# Patient Record
Sex: Male | Born: 1960 | Race: Black or African American | Hispanic: No | Smoking: Never smoker
Health system: Southern US, Community
[De-identification: ages and names within clinical notes are randomized; demographics above are authoritative.]

## PROBLEM LIST (undated history)

## (undated) DIAGNOSIS — E119 Type 2 diabetes mellitus without complications: Secondary | ICD-10-CM

## (undated) DIAGNOSIS — M1990 Unspecified osteoarthritis, unspecified site: Secondary | ICD-10-CM

## (undated) DIAGNOSIS — K819 Cholecystitis, unspecified: Secondary | ICD-10-CM

## (undated) DIAGNOSIS — K760 Fatty (change of) liver, not elsewhere classified: Secondary | ICD-10-CM

## (undated) DIAGNOSIS — E669 Obesity, unspecified: Secondary | ICD-10-CM

## (undated) HISTORY — DX: Cholecystitis, unspecified: K81.9

## (undated) HISTORY — DX: Unspecified osteoarthritis, unspecified site: M19.90

## (undated) HISTORY — DX: Obesity, unspecified: E66.9

## (undated) HISTORY — PX: CHOLECYSTECTOMY: SHX55

## (undated) HISTORY — DX: Fatty (change of) liver, not elsewhere classified: K76.0

---

## 2000-11-26 ENCOUNTER — Emergency Department (HOSPITAL_COMMUNITY): Admission: EM | Admit: 2000-11-26 | Discharge: 2000-11-26 | Payer: Self-pay | Admitting: Emergency Medicine

## 2002-10-07 ENCOUNTER — Emergency Department (HOSPITAL_COMMUNITY): Admission: AC | Admit: 2002-10-07 | Discharge: 2002-10-07 | Payer: Self-pay

## 2004-07-26 ENCOUNTER — Emergency Department (HOSPITAL_COMMUNITY): Admission: EM | Admit: 2004-07-26 | Discharge: 2004-07-26 | Payer: Self-pay | Admitting: Emergency Medicine

## 2005-02-27 ENCOUNTER — Emergency Department (HOSPITAL_COMMUNITY): Admission: EM | Admit: 2005-02-27 | Discharge: 2005-02-27 | Payer: Self-pay | Admitting: Emergency Medicine

## 2010-07-31 ENCOUNTER — Ambulatory Visit: Payer: Self-pay | Admitting: Internal Medicine

## 2010-07-31 ENCOUNTER — Encounter (INDEPENDENT_AMBULATORY_CARE_PROVIDER_SITE_OTHER): Payer: Self-pay | Admitting: *Deleted

## 2010-07-31 LAB — CONVERTED CEMR LAB
Basophils Relative: 0 % (ref 0–1)
CO2: 27 meq/L (ref 19–32)
Chloride: 104 meq/L (ref 96–112)
Eosinophils Absolute: 0 10*3/uL (ref 0.0–0.7)
Glucose, Bld: 94 mg/dL (ref 70–99)
HCT: 46.3 % (ref 39.0–52.0)
Hemoglobin: 15.7 g/dL (ref 13.0–17.0)
Lymphocytes Relative: 41 % (ref 12–46)
Lymphs Abs: 3.3 10*3/uL (ref 0.7–4.0)
MCHC: 33.9 g/dL (ref 30.0–36.0)
Monocytes Absolute: 0.7 10*3/uL (ref 0.1–1.0)
Monocytes Relative: 8 % (ref 3–12)
Neutro Abs: 4.2 10*3/uL (ref 1.7–7.7)
Neutrophils Relative %: 51 % (ref 43–77)
Platelets: 224 10*3/uL (ref 150–400)
RBC: 4.97 M/uL (ref 4.22–5.81)
RDW: 12.6 % (ref 11.5–15.5)
TSH: 0.688 microintl units/mL (ref 0.350–4.500)
Total Bilirubin: 0.5 mg/dL (ref 0.3–1.2)

## 2010-08-07 ENCOUNTER — Encounter (INDEPENDENT_AMBULATORY_CARE_PROVIDER_SITE_OTHER): Payer: Self-pay | Admitting: *Deleted

## 2010-08-07 ENCOUNTER — Ambulatory Visit (HOSPITAL_COMMUNITY): Admission: RE | Admit: 2010-08-07 | Discharge: 2010-08-07 | Payer: Self-pay | Admitting: Family Medicine

## 2010-08-07 LAB — CONVERTED CEMR LAB
Sex Hormone Binding: 22 nmol/L (ref 13–71)
Testosterone Free: 71.8 pg/mL (ref 47.0–244.0)
Testosterone: 295.12 ng/dL (ref 250–890)
VLDL: 13 mg/dL (ref 0–40)

## 2010-08-20 ENCOUNTER — Ambulatory Visit (HOSPITAL_COMMUNITY): Admission: RE | Admit: 2010-08-20 | Discharge: 2010-08-20 | Payer: Self-pay | Admitting: Internal Medicine

## 2010-10-31 ENCOUNTER — Encounter
Admission: RE | Admit: 2010-10-31 | Discharge: 2010-11-24 | Payer: Self-pay | Source: Home / Self Care | Attending: Family Medicine | Admitting: Family Medicine

## 2010-11-04 ENCOUNTER — Encounter: Admission: RE | Admit: 2010-11-04 | Payer: Self-pay | Source: Home / Self Care | Admitting: Family Medicine

## 2015-01-06 ENCOUNTER — Emergency Department (HOSPITAL_COMMUNITY): Payer: Self-pay

## 2015-01-06 ENCOUNTER — Encounter (HOSPITAL_COMMUNITY): Payer: Self-pay | Admitting: Family Medicine

## 2015-01-06 ENCOUNTER — Inpatient Hospital Stay (HOSPITAL_COMMUNITY)
Admission: EM | Admit: 2015-01-06 | Discharge: 2015-01-09 | DRG: 419 | Disposition: A | Discharge status: Home / Self Care | Payer: Self-pay | Attending: Internal Medicine | Admitting: Internal Medicine

## 2015-01-06 DIAGNOSIS — K8066 Calculus of gallbladder and bile duct with acute and chronic cholecystitis without obstruction: Principal | ICD-10-CM | POA: Diagnosis present

## 2015-01-06 DIAGNOSIS — K8010 Calculus of gallbladder with chronic cholecystitis without obstruction: Secondary | ICD-10-CM | POA: Diagnosis present

## 2015-01-06 DIAGNOSIS — R109 Unspecified abdominal pain: Secondary | ICD-10-CM

## 2015-01-06 DIAGNOSIS — Z6833 Body mass index (BMI) 33.0-33.9, adult: Secondary | ICD-10-CM

## 2015-01-06 DIAGNOSIS — K8050 Calculus of bile duct without cholangitis or cholecystitis without obstruction: Secondary | ICD-10-CM

## 2015-01-06 DIAGNOSIS — E119 Type 2 diabetes mellitus without complications: Secondary | ICD-10-CM | POA: Diagnosis present

## 2015-01-06 DIAGNOSIS — K819 Cholecystitis, unspecified: Secondary | ICD-10-CM

## 2015-01-06 DIAGNOSIS — R932 Abnormal findings on diagnostic imaging of liver and biliary tract: Secondary | ICD-10-CM

## 2015-01-06 DIAGNOSIS — M1612 Unilateral primary osteoarthritis, left hip: Secondary | ICD-10-CM | POA: Diagnosis present

## 2015-01-06 HISTORY — DX: Type 2 diabetes mellitus without complications: E11.9

## 2015-01-06 LAB — CBC WITH DIFFERENTIAL/PLATELET
BASOS ABS: 0 10*3/uL (ref 0.0–0.1)
Basophils Relative: 0 % (ref 0–1)
EOS ABS: 0 10*3/uL (ref 0.0–0.7)
Eosinophils Relative: 0 % (ref 0–5)
HCT: 43.6 % (ref 39.0–52.0)
HEMOGLOBIN: 15 g/dL (ref 13.0–17.0)
LYMPHS ABS: 3.8 10*3/uL (ref 0.7–4.0)
Lymphocytes Relative: 35 % (ref 12–46)
MCH: 31.1 pg (ref 26.0–34.0)
MCHC: 34.4 g/dL (ref 30.0–36.0)
MCV: 90.3 fL (ref 78.0–100.0)
MONOS PCT: 6 % (ref 3–12)
Monocytes Absolute: 0.7 10*3/uL (ref 0.1–1.0)
Neutro Abs: 6.4 10*3/uL (ref 1.7–7.7)
Neutrophils Relative %: 59 % (ref 43–77)
PLATELETS: 242 10*3/uL (ref 150–400)
RBC: 4.83 MIL/uL (ref 4.22–5.81)
RDW: 12.5 % (ref 11.5–15.5)
WBC: 10.9 10*3/uL — AB (ref 4.0–10.5)

## 2015-01-06 LAB — GLUCOSE, CAPILLARY
GLUCOSE-CAPILLARY: 207 mg/dL — AB (ref 70–99)
Glucose-Capillary: 130 mg/dL — ABNORMAL HIGH (ref 70–99)

## 2015-01-06 LAB — LIPASE, BLOOD: Lipase: 23 U/L (ref 11–59)

## 2015-01-06 LAB — COMPREHENSIVE METABOLIC PANEL
ALT: 14 U/L (ref 0–53)
AST: 16 U/L (ref 0–37)
Albumin: 3.4 g/dL — ABNORMAL LOW (ref 3.5–5.2)
Alkaline Phosphatase: 36 U/L — ABNORMAL LOW (ref 39–117)
Anion gap: 10 (ref 5–15)
BILIRUBIN TOTAL: 0.6 mg/dL (ref 0.3–1.2)
BUN: 7 mg/dL (ref 6–23)
CALCIUM: 8.9 mg/dL (ref 8.4–10.5)
CO2: 21 mmol/L (ref 19–32)
Chloride: 108 mmol/L (ref 96–112)
Creatinine, Ser: 1.22 mg/dL (ref 0.50–1.35)
GFR, EST AFRICAN AMERICAN: 75 mL/min — AB (ref >90)
GFR, EST NON AFRICAN AMERICAN: 65 mL/min — AB (ref >90)
Glucose, Bld: 120 mg/dL — ABNORMAL HIGH (ref 70–99)
Potassium: 3.8 mmol/L (ref 3.5–5.1)
Sodium: 139 mmol/L (ref 135–145)
TOTAL PROTEIN: 6.1 g/dL (ref 6.0–8.3)

## 2015-01-06 MED ORDER — PANTOPRAZOLE SODIUM 40 MG IV SOLR
40.0000 mg | Freq: Every day | INTRAVENOUS | Status: DC
  Administered 2015-01-06 – 2015-01-08 (×3): 40 mg via INTRAVENOUS
  Filled 2015-01-06 (×3): qty 40

## 2015-01-06 MED ORDER — HEPARIN SODIUM (PORCINE) 5000 UNIT/ML IJ SOLN
5000.0000 [IU] | Freq: Once | INTRAMUSCULAR | Status: AC
  Administered 2015-01-06: 5000 [IU] via SUBCUTANEOUS
  Filled 2015-01-06: qty 1

## 2015-01-06 MED ORDER — INSULIN ASPART 100 UNIT/ML ~~LOC~~ SOLN
0.0000 [IU] | Freq: Three times a day (TID) | SUBCUTANEOUS | Status: DC
  Administered 2015-01-07 (×2): 2 [IU] via SUBCUTANEOUS
  Administered 2015-01-08: 3 [IU] via SUBCUTANEOUS
  Administered 2015-01-08: 2 [IU] via SUBCUTANEOUS
  Administered 2015-01-09: 3 [IU] via SUBCUTANEOUS

## 2015-01-06 MED ORDER — SODIUM CHLORIDE 0.9 % IV BOLUS (SEPSIS)
1000.0000 mL | Freq: Once | INTRAVENOUS | Status: AC
  Administered 2015-01-06: 1000 mL via INTRAVENOUS

## 2015-01-06 MED ORDER — MORPHINE SULFATE 2 MG/ML IJ SOLN
1.0000 mg | INTRAMUSCULAR | Status: DC | PRN
  Administered 2015-01-07 – 2015-01-08 (×6): 4 mg via INTRAVENOUS
  Filled 2015-01-06 (×6): qty 2

## 2015-01-06 MED ORDER — DIPHENHYDRAMINE HCL 12.5 MG/5ML PO ELIX: 12.5000 mg | ORAL_SOLUTION | Freq: Four times a day (QID) | ORAL | Status: DC | PRN

## 2015-01-06 MED ORDER — CEFTRIAXONE SODIUM IN DEXTROSE 40 MG/ML IV SOLN
2.0000 g | INTRAVENOUS | Status: DC
  Administered 2015-01-06 – 2015-01-07 (×2): 2 g via INTRAVENOUS
  Filled 2015-01-06 (×3): qty 50

## 2015-01-06 MED ORDER — ONDANSETRON HCL 4 MG/2ML IJ SOLN: 4.0000 mg | Freq: Four times a day (QID) | INTRAMUSCULAR | Status: DC | PRN

## 2015-01-06 MED ORDER — DIPHENHYDRAMINE HCL 50 MG/ML IJ SOLN: 12.5000 mg | Freq: Four times a day (QID) | INTRAMUSCULAR | Status: DC | PRN

## 2015-01-06 MED ORDER — ONDANSETRON HCL 4 MG/2ML IJ SOLN
4.0000 mg | Freq: Once | INTRAMUSCULAR | Status: AC
  Administered 2015-01-06: 4 mg via INTRAVENOUS
  Filled 2015-01-06: qty 2

## 2015-01-06 MED ORDER — POTASSIUM CHLORIDE IN NACL 20-0.9 MEQ/L-% IV SOLN
INTRAVENOUS | Status: DC
  Administered 2015-01-06: 21:00:00 via INTRAVENOUS
  Filled 2015-01-06 (×3): qty 1000

## 2015-01-06 MED ORDER — MORPHINE SULFATE 4 MG/ML IJ SOLN
4.0000 mg | Freq: Once | INTRAMUSCULAR | Status: AC
  Administered 2015-01-06: 4 mg via INTRAVENOUS
  Filled 2015-01-06: qty 1

## 2015-01-06 NOTE — ED Notes (Signed)
Attempted report, was told nurse just walked into a patient room will return call.

## 2015-01-06 NOTE — H&P (Signed)
Kevin Sanchez is an 54 y.o. male.   Chief Complaint: abd pain HPI: 54yo obese AAM came to the ED earlier today bc of ongoing abdominal pain. He states he ate a hamburger yesterday and then developed RUQ/rt abd pain with N/V. It was persistent so he came to the ED. Pain is somewhat better after meds. Denies f/c/d/c/jaundice/wt loss. Had similar episode about 2 weeks ago and went to [facility] and underwent u/s and ct was told he had gallstones. I don't have those records. He doesn't have a PCP but gets his metformin from a clinic. Denies etoh and tob and drugs.   Past Medical History  Diagnosis Date  . Diabetes mellitus without complication     History reviewed. No pertinent past surgical history.  History reviewed. No pertinent family history. Social History:  reports that he has never smoked. He does not have any smokeless tobacco history on file. He reports that he does not drink alcohol or use illicit drugs.  Allergies: No Known Allergies   (Not in a hospital admission)  Results for orders placed or performed during the hospital encounter of 01/06/15 (from the past 48 hour(s))  CBC with Differential     Status: Abnormal   Collection Time: 01/06/15 12:51 PM  Result Value Ref Range   WBC 10.9 (H) 4.0 - 10.5 K/uL   RBC 4.83 4.22 - 5.81 MIL/uL   Hemoglobin 15.0 13.0 - 17.0 g/dL   HCT 43.6 39.0 - 52.0 %   MCV 90.3 78.0 - 100.0 fL   MCH 31.1 26.0 - 34.0 pg   MCHC 34.4 30.0 - 36.0 g/dL   RDW 12.5 11.5 - 15.5 %   Platelets 242 150 - 400 K/uL   Neutrophils Relative % 59 43 - 77 %   Neutro Abs 6.4 1.7 - 7.7 K/uL   Lymphocytes Relative 35 12 - 46 %   Lymphs Abs 3.8 0.7 - 4.0 K/uL   Monocytes Relative 6 3 - 12 %   Monocytes Absolute 0.7 0.1 - 1.0 K/uL   Eosinophils Relative 0 0 - 5 %   Eosinophils Absolute 0.0 0.0 - 0.7 K/uL   Basophils Relative 0 0 - 1 %   Basophils Absolute 0.0 0.0 - 0.1 K/uL  Comprehensive metabolic panel     Status: Abnormal   Collection  Time: 01/06/15 12:51 PM  Result Value Ref Range   Sodium 139 135 - 145 mmol/L   Potassium 3.8 3.5 - 5.1 mmol/L   Chloride 108 96 - 112 mmol/L   CO2 21 19 - 32 mmol/L   Glucose, Bld 120 (H) 70 - 99 mg/dL   BUN 7 6 - 23 mg/dL   Creatinine, Ser 1.22 0.50 - 1.35 mg/dL   Calcium 8.9 8.4 - 10.5 mg/dL   Total Protein 6.1 6.0 - 8.3 g/dL   Albumin 3.4 (L) 3.5 - 5.2 g/dL   AST 16 0 - 37 U/L   ALT 14 0 - 53 U/L   Alkaline Phosphatase 36 (L) 39 - 117 U/L   Total Bilirubin 0.6 0.3 - 1.2 mg/dL   GFR calc non Af Amer 65 (L) >90 mL/min   GFR calc Af Amer 75 (L) >90 mL/min    Comment: (NOTE) The eGFR has been calculated using the CKD EPI equation. This calculation has not been validated in all clinical situations. eGFR's persistently <90 mL/min signify possible Chronic Kidney Disease.    Anion gap 10 5 - 15  Lipase, blood  Status: None   Collection Time: 01/06/15 12:51 PM  Result Value Ref Range   Lipase 23 11 - 59 U/L   US Abdomen Complete  01/06/2015   CLINICAL DATA:  Abdominal pain  EXAM: ULTRASOUND ABDOMEN COMPLETE  COMPARISON:  12/11/2014.  FINDINGS: Gallbladder: Gallstones are identified within the gallbladder fundus. The gallbladder wall is mildly thickened measuring 3 mm. Negative sonographic Murphy's sign.  Common bile duct: Diameter: 4.8 mm.  Liver: No focal lesion identified. The liver exhibits increased echogenicity compatible with hepatic steatosis.  IVC: No abnormality visualized.  Pancreas: Visualized portion unremarkable.  Spleen: Size and appearance within normal limits.  Right Kidney: Length: 12.2 cm. Echogenicity within normal limits. No mass or hydronephrosis visualized.  Left Kidney: Length: 12.4 cm. Echogenicity within normal limits. No mass or hydronephrosis visualized.  Abdominal aorta: No aneurysm visualized.  Other findings: None.  IMPRESSION: 1. Gallstones. The gallbladder wall is upper limits of normal in thickness measuring 3 mm. Cannot rule out mild/early cholecystitis.  If further imaging is clinically indicated a hepatobiliary scan may be helpful to assess the patency of the cystic duct. 2. Hepatic steatosis.   Electronically Signed   By: Kerby Moors M.D.   On: 01/06/2015 15:59   Dg Hip Unilat With Pelvis 2-3 Views Left  01/06/2015   CLINICAL DATA:  Chronic left anterior hip/groin pain that has been ongoing for over 6 years and has been progressively worsening; pt states he has difficulty walking on it due to pain; pt states he had an injury many years ago due to playing football and was supposed to have surgery but never did; pt states he has been told he had spurring also.  EXAM: LEFT HIP (WITH PELVIS) 2-3 VIEWS  COMPARISON:  08/07/2010  FINDINGS: Exam demonstrates moderate degenerative changes of the left hip and mild degenerative change of the right hip without significant change. No acute fracture or dislocation. Remainder of the exam is unchanged.  IMPRESSION: No acute findings.  Moderate osteoarthritic change of the left hip and mild osteoarthritic change of the right hip.   Electronically Signed   By: Marin Olp M.D.   On: 01/06/2015 15:26    Review of Systems  Constitutional: Negative for fever, chills and weight loss.  HENT: Negative for nosebleeds.   Eyes: Negative for blurred vision.  Respiratory: Negative for shortness of breath.   Cardiovascular: Negative for chest pain, palpitations, orthopnea and PND.       Denies DOE  Gastrointestinal: Positive for nausea, vomiting and abdominal pain. Negative for diarrhea, constipation, blood in stool and melena.  Genitourinary: Negative for dysuria and hematuria.  Musculoskeletal: Negative.        L hip pain  Skin: Negative for itching and rash.  Neurological: Negative for dizziness, focal weakness, seizures, loss of consciousness and headaches.       Denies TIAs, amaurosis fugax  Endo/Heme/Allergies: Does not bruise/bleed easily.  Psychiatric/Behavioral: The patient is not nervous/anxious.      Blood pressure 115/67, pulse 66, temperature 98.6 F (37 C), resp. rate 18, height '6\' 2"'  (1.88 m), weight 246 lb (111.585 kg), SpO2 98 %. Physical Exam  Vitals reviewed. Constitutional: He is oriented to person, place, and time. He appears well-developed and well-nourished. No distress.  Morbidly obese  HENT:  Head: Normocephalic and atraumatic.  Right Ear: External ear normal.  Left Ear: External ear normal.  Eyes: Conjunctivae are normal. No scleral icterus.  Neck: Normal range of motion. Neck supple. No tracheal deviation present. No thyromegaly  present.  Cardiovascular: Normal rate and normal heart sounds.   Respiratory: Effort normal and breath sounds normal. No stridor. No respiratory distress. He has no wheezes.  GI: Soft. Normal appearance. He exhibits no distension. There is tenderness in the right upper quadrant. There is no rigidity, no rebound and no guarding.  Mild RUQ TTP.   Musculoskeletal: He exhibits no edema or tenderness.  Lymphadenopathy:    He has no cervical adenopathy.  Neurological: He is alert and oriented to person, place, and time. He exhibits normal muscle tone.  Skin: Skin is warm and dry. No rash noted. He is not diaphoretic. No erythema. No pallor.  Psychiatric: He has a normal mood and affect. His behavior is normal. Judgment and thought content normal.     Assessment/Plan Early acute cholecystitis Morbid obesity DM2 L hip osteoarthritis  2 ED visits in 2 weeks. Failed outpt mgmt. Has mild wbc and borderline wall thickness. So i think it is safest to admit him, iv abx, plan cholecystectomy  We discussed gallbladder disease. We discussed non-operative and operative management. We discussed the signs & symptoms of acute cholecystitis  I discussed laparoscopic cholecystectomy with IOC in detail.  The patient was shown diagrams detailing the procedure.  We discussed the risks and benefits of a laparoscopic cholecystectomy including, but not limited  to bleeding, infection, injury to surrounding structures such as the intestine or liver, bile leak, retained gallstones, need to convert to an open procedure, prolonged diarrhea, blood clots such as  DVT, common bile duct injury, anesthesia risks, and possible need for additional procedures.  We discussed the typical post-operative recovery course. I explained that the likelihood of improvement of their symptoms is good.  Plan surgery in AM NPO p MN  SCDs IVF, IV abx Pain and nausea meds  Leighton Ruff. Redmond Pulling, MD, FACS General, Bariatric, & Minimally Invasive Surgery [facility], [place]    [facility] M 01/06/2015, 5:39 PM

## 2015-01-06 NOTE — ED Notes (Signed)
Pt here for upper abd pain. sts recently dx with gallstones. sts every time he eats he feels sick. sts also some left hip pain.

## 2015-01-06 NOTE — ED Provider Notes (Signed)
CSN: [id number]     Arrival date & time 01/06/15  1222 History   First MD Initiated Contact with Patient 01/06/15 1405     Chief Complaint  Patient presents with  . Abdominal Pain  . Hip Pain     (Consider location/radiation/quality/duration/timing/severity/associated sxs/prior Treatment) HPI Comments: Patient is a 54 year old male past medical history significant for DM presenting to the emergency department for 2 complaints. Patient states that he has been having right upper quadrant abdominal pain since eating hamburger yesterday with associated nonbloody nonbilious nausea, vomiting, nonbloody diarrhea. He states his right upper quadrant pain is improving with time. No modifying factors identified. He states a couple weeks ago he was told he had gallstones and should see a Careers adviser. He has not yet. Patient is also complaining of left hip pain, worse with exercising. He states he was told 6 years ago he needed a hip replacement but has not contacted the orthopedist. He is a tried NSAIDs with little to no improvement. Denies any falls or trauma. No abdominal surgical history.   Past Medical History  Diagnosis Date  . Diabetes mellitus without complication    History reviewed. No pertinent past surgical history. History reviewed. No pertinent family history. History  Substance Use Topics  . Smoking status: Never Smoker   . Smokeless tobacco: Not on file  . Alcohol Use: No    Review of Systems  Gastrointestinal: Positive for nausea, vomiting (resolved), abdominal pain and diarrhea (resolved).  All other systems reviewed and are negative.     Allergies  Review of patient's allergies indicates no known allergies.  Home Medications   Prior to Admission medications   Not on File   BP 126/71 mmHg  Pulse 63  Temp(Src) 98.6 F (37 C)  Resp 18  Ht  (1.88 m)  Wt 246 lb (111.585 kg)  BMI 31.57 kg/m2  SpO2 99% Physical Exam  Constitutional: He is oriented to person, place,  and time. He appears well-developed and well-nourished. No distress.  HENT:  Head: Normocephalic and atraumatic.  Right Ear: External ear normal.  Left Ear: External ear normal.  Nose: Nose normal.  Eyes: Conjunctivae are normal.  Neck: Neck supple.  Cardiovascular: Normal rate, regular rhythm and normal heart sounds.   Abdominal: Soft. Bowel sounds are normal. He exhibits no distension. There is tenderness. There is no rebound and no guarding.  Neurological: He is alert and oriented to person, place, and time.  Skin: Skin is warm and dry. He is not diaphoretic.  Nursing note and vitals reviewed.   ED Course  Procedures (including critical care time) Medications  morphine 4 MG/ML injection 4 mg (4 mg Intravenous Given 01/06/15 1431)  sodium chloride 0.9 % bolus 1,000 mL (0 mLs Intravenous Stopped 01/06/15 1552)  ondansetron (ZOFRAN) injection 4 mg (4 mg Intravenous Given 01/06/15 1431)    Labs Review Labs Reviewed  CBC WITH DIFFERENTIAL/PLATELET - Abnormal; Notable for the following:    WBC 10.9 (*)    All other components within normal limits  COMPREHENSIVE METABOLIC PANEL - Abnormal; Notable for the following:    Glucose, Bld 120 (*)    Albumin 3.4 (*)    Alkaline Phosphatase 36 (*)    GFR calc non Af Amer 65 (*)    GFR calc Af Amer 75 (*)    All other components within normal limits  LIPASE, BLOOD    Imaging Review US Abdomen Complete  01/06/2015   CLINICAL DATA:  Abdominal pain  EXAM: ULTRASOUND  ABDOMEN COMPLETE  COMPARISON:  12/11/2014.  FINDINGS: Gallbladder: Gallstones are identified within the gallbladder fundus. The gallbladder wall is mildly thickened measuring 3 mm. Negative sonographic Murphy's sign.  Common bile duct: Diameter: 4.8 mm.  Liver: No focal lesion identified. The liver exhibits increased echogenicity compatible with hepatic steatosis.  IVC: No abnormality visualized.  Pancreas: Visualized portion unremarkable.  Spleen: Size and appearance within normal  limits.  Right Kidney: Length: 12.2 cm. Echogenicity within normal limits. No mass or hydronephrosis visualized.  Left Kidney: Length: 12.4 cm. Echogenicity within normal limits. No mass or hydronephrosis visualized.  Abdominal aorta: No aneurysm visualized.  Other findings: None.  IMPRESSION: 1. Gallstones. The gallbladder wall is upper limits of normal in thickness measuring 3 mm. Cannot rule out mild/early cholecystitis. If further imaging is clinically indicated a hepatobiliary scan may be helpful to assess the patency of the cystic duct. 2. Hepatic steatosis.   Electronically Signed   By: Signa Kellaylor  Stroud M.D.   On: 01/06/2015 15:59   Dg Hip Unilat With Pelvis 2-3 Views Left  01/06/2015   CLINICAL DATA:  Chronic left anterior hip/groin pain that has been ongoing for over 6 years and has been progressively worsening; pt states he has difficulty walking on it due to pain; pt states he had an injury many years ago due to playing football and was supposed to have surgery but never did; pt states he has been told he had spurring also.  EXAM: LEFT HIP (WITH PELVIS) 2-3 VIEWS  COMPARISON:  08/07/2010  FINDINGS: Exam demonstrates moderate degenerative changes of the left hip and mild degenerative change of the right hip without significant change. No acute fracture or dislocation. Remainder of the exam is unchanged.  IMPRESSION: No acute findings.  Moderate osteoarthritic change of the left hip and mild osteoarthritic change of the right hip.   Electronically Signed   By: Elberta Fortisaniel  Boyle M.D.   On: 01/06/2015 15:26     EKG Interpretation None      4:27 PM Discussed patient with Dr. Andrey CampanileWilson from general surgery who will see the patient in consultation in the ED.   MDM   Final diagnoses:  Abdominal pain in male    Filed Vitals:   01/06/15 1445  BP: 126/71  Pulse: 63  Temp:   Resp:    Afebrile, NAD, non-toxic appearing, AAOx4.   1) RUQ Pain: RUQ TTP without guarding, rigidity or rebound. Pain  improved on emergency department. LFTs within normal limits. Ultrasound reviewed with possible mild or early cholecystitis, general surgery consultation and will see patient in emergency department.  2) Hip Pain: Neurovascularly intact. Normal sensation. No evidence of compartment syndrome. X-ray reviewed, arthritis noted. No erythema, warmth or decreased range of motion to suggest infectious source of pain. Advised orthopedic follow up.   Patient d/w with Dr. Rubin PayorPickering, agrees with plan.    Francee PiccoloJennifer Jenasis Straley, PA-C 01/06/15 1631  Benjiman CoreNathan Pickering, MD 01/06/15 [phone]

## 2015-01-06 NOTE — ED Notes (Signed)
Patient transported to X-ray 

## 2015-01-06 NOTE — ED Notes (Signed)
Advised PA Patient requesting pain medication. PA states wait for general surgery.

## 2015-01-07 ENCOUNTER — Observation Stay (HOSPITAL_COMMUNITY): Payer: Self-pay

## 2015-01-07 ENCOUNTER — Observation Stay (HOSPITAL_COMMUNITY): Payer: MEDICAID | Admitting: Anesthesiology

## 2015-01-07 ENCOUNTER — Encounter (HOSPITAL_COMMUNITY): Payer: Self-pay | Admitting: General Surgery

## 2015-01-07 ENCOUNTER — Observation Stay (HOSPITAL_COMMUNITY): Payer: Self-pay | Admitting: Anesthesiology

## 2015-01-07 ENCOUNTER — Encounter (HOSPITAL_COMMUNITY): Admission: EM | Disposition: A | Discharge status: Home / Self Care | Payer: Self-pay | Source: Home / Self Care

## 2015-01-07 DIAGNOSIS — R932 Abnormal findings on diagnostic imaging of liver and biliary tract: Secondary | ICD-10-CM

## 2015-01-07 DIAGNOSIS — K8000 Calculus of gallbladder with acute cholecystitis without obstruction: Secondary | ICD-10-CM

## 2015-01-07 HISTORY — PX: CHOLECYSTECTOMY: SHX55

## 2015-01-07 LAB — GLUCOSE, CAPILLARY
GLUCOSE-CAPILLARY: 137 mg/dL — AB (ref 70–99)
GLUCOSE-CAPILLARY: 139 mg/dL — AB (ref 70–99)
Glucose-Capillary: 163 mg/dL — ABNORMAL HIGH (ref 70–99)
Glucose-Capillary: 125 mg/dL — ABNORMAL HIGH (ref 70–99)

## 2015-01-07 LAB — CBC
HCT: 38.5 % — ABNORMAL LOW (ref 39.0–52.0)
Hemoglobin: 13.4 g/dL (ref 13.0–17.0)
MCH: 31.2 pg (ref 26.0–34.0)
MCHC: 34.8 g/dL (ref 30.0–36.0)
MCV: 89.5 fL (ref 78.0–100.0)
PLATELETS: 185 10*3/uL (ref 150–400)
RBC: 4.3 MIL/uL (ref 4.22–5.81)
RDW: 12.6 % (ref 11.5–15.5)
WBC: 8 10*3/uL (ref 4.0–10.5)

## 2015-01-07 LAB — COMPREHENSIVE METABOLIC PANEL
ALT: 12 U/L (ref 0–53)
AST: 12 U/L (ref 0–37)
Albumin: 2.7 g/dL — ABNORMAL LOW (ref 3.5–5.2)
Alkaline Phosphatase: 31 U/L — ABNORMAL LOW (ref 39–117)
Anion gap: 6 (ref 5–15)
BUN: <5 mg/dL — ABNORMAL LOW (ref 6–23)
CO2: 25 mmol/L (ref 19–32)
Calcium: 8.3 mg/dL — ABNORMAL LOW (ref 8.4–10.5)
Chloride: 109 mmol/L (ref 96–112)
Creatinine, Ser: 1.23 mg/dL (ref 0.50–1.35)
GFR, EST AFRICAN AMERICAN: 75 mL/min — AB (ref >90)
GFR, EST NON AFRICAN AMERICAN: 64 mL/min — AB (ref >90)
Glucose, Bld: 127 mg/dL — ABNORMAL HIGH (ref 70–99)
POTASSIUM: 3.7 mmol/L (ref 3.5–5.1)
Sodium: 140 mmol/L (ref 135–145)
Total Bilirubin: 0.7 mg/dL (ref 0.3–1.2)
Total Protein: 5 g/dL — ABNORMAL LOW (ref 6.0–8.3)

## 2015-01-07 LAB — SURGICAL PCR SCREEN
MRSA, PCR: NEGATIVE
Staphylococcus aureus: NEGATIVE

## 2015-01-07 SURGERY — LAPAROSCOPIC CHOLECYSTECTOMY WITH INTRAOPERATIVE CHOLANGIOGRAM
Anesthesia: General

## 2015-01-07 MED ORDER — GLYCOPYRROLATE 0.2 MG/ML IJ SOLN
INTRAMUSCULAR | Status: AC
  Filled 2015-01-07: qty 2

## 2015-01-07 MED ORDER — PROPOFOL 10 MG/ML IV BOLUS
INTRAVENOUS | Status: DC | PRN
  Administered 2015-01-07: 350 mg via INTRAVENOUS

## 2015-01-07 MED ORDER — HYDROMORPHONE HCL 1 MG/ML IJ SOLN
INTRAMUSCULAR | Status: AC
  Filled 2015-01-07: qty 1

## 2015-01-07 MED ORDER — GLUCAGON HCL RDNA (DIAGNOSTIC) 1 MG IJ SOLR
INTRAMUSCULAR | Status: DC | PRN
  Administered 2015-01-07: 1 mg via INTRAVENOUS

## 2015-01-07 MED ORDER — ROCURONIUM BROMIDE 50 MG/5ML IV SOLN
INTRAVENOUS | Status: AC
  Filled 2015-01-07: qty 1

## 2015-01-07 MED ORDER — ONDANSETRON HCL 4 MG/2ML IJ SOLN
INTRAMUSCULAR | Status: AC
  Filled 2015-01-07: qty 2

## 2015-01-07 MED ORDER — ONDANSETRON HCL 4 MG/2ML IJ SOLN
INTRAMUSCULAR | Status: DC | PRN
  Administered 2015-01-07: 4 mg via INTRAVENOUS

## 2015-01-07 MED ORDER — NEOSTIGMINE METHYLSULFATE 10 MG/10ML IV SOLN
INTRAVENOUS | Status: DC | PRN
  Administered 2015-01-07: 3 mg via INTRAVENOUS

## 2015-01-07 MED ORDER — FENTANYL CITRATE 0.05 MG/ML IJ SOLN
INTRAMUSCULAR | Status: DC | PRN
  Administered 2015-01-07 (×2): 50 ug via INTRAVENOUS
  Administered 2015-01-07: 150 ug via INTRAVENOUS

## 2015-01-07 MED ORDER — LACTATED RINGERS IV SOLN
INTRAVENOUS | Status: DC | PRN
  Administered 2015-01-07 (×2): via INTRAVENOUS

## 2015-01-07 MED ORDER — GLYCOPYRROLATE 0.2 MG/ML IJ SOLN
INTRAMUSCULAR | Status: DC | PRN
  Administered 2015-01-07: 0.4 mg via INTRAVENOUS

## 2015-01-07 MED ORDER — PROPOFOL 10 MG/ML IV BOLUS
INTRAVENOUS | Status: AC
  Filled 2015-01-07: qty 20

## 2015-01-07 MED ORDER — BUPIVACAINE-EPINEPHRINE (PF) 0.25% -1:200000 IJ SOLN
INTRAMUSCULAR | Status: AC
  Filled 2015-01-07: qty 30

## 2015-01-07 MED ORDER — ROCURONIUM BROMIDE 100 MG/10ML IV SOLN
INTRAVENOUS | Status: DC | PRN
  Administered 2015-01-07: 30 mg via INTRAVENOUS

## 2015-01-07 MED ORDER — HYDROMORPHONE HCL 1 MG/ML IJ SOLN
0.2500 mg | INTRAMUSCULAR | Status: DC | PRN
  Administered 2015-01-07 (×4): 0.5 mg via INTRAVENOUS

## 2015-01-07 MED ORDER — SODIUM CHLORIDE 0.9 % IR SOLN
Status: DC | PRN
  Administered 2015-01-07: 1000 mL

## 2015-01-07 MED ORDER — DEXTROSE-NACL 5-0.9 % IV SOLN
INTRAVENOUS | Status: DC
  Administered 2015-01-07 – 2015-01-08 (×2): via INTRAVENOUS

## 2015-01-07 MED ORDER — EPHEDRINE SULFATE 50 MG/ML IJ SOLN
INTRAMUSCULAR | Status: DC | PRN
  Administered 2015-01-07 (×2): 10 mg via INTRAVENOUS

## 2015-01-07 MED ORDER — SUCCINYLCHOLINE CHLORIDE 20 MG/ML IJ SOLN
INTRAMUSCULAR | Status: DC | PRN
  Administered 2015-01-07: 140 mg via INTRAVENOUS

## 2015-01-07 MED ORDER — ARTIFICIAL TEARS OP OINT
TOPICAL_OINTMENT | OPHTHALMIC | Status: AC
  Filled 2015-01-07: qty 3.5

## 2015-01-07 MED ORDER — 0.9 % SODIUM CHLORIDE (POUR BTL) OPTIME
TOPICAL | Status: DC | PRN
  Administered 2015-01-07: 200 mL

## 2015-01-07 MED ORDER — FENTANYL CITRATE 0.05 MG/ML IJ SOLN
INTRAMUSCULAR | Status: AC
  Filled 2015-01-07: qty 5

## 2015-01-07 MED ORDER — NEOSTIGMINE METHYLSULFATE 10 MG/10ML IV SOLN
INTRAVENOUS | Status: AC
  Filled 2015-01-07: qty 1

## 2015-01-07 MED ORDER — ONDANSETRON HCL 4 MG/2ML IJ SOLN: 4.0000 mg | Freq: Once | INTRAMUSCULAR | Status: DC | PRN

## 2015-01-07 MED ORDER — LIDOCAINE HCL (CARDIAC) 20 MG/ML IV SOLN
INTRAVENOUS | Status: DC | PRN
Start: 2015-01-07 — End: 2015-01-07
  Administered 2015-01-07: 100 mg via INTRAVENOUS

## 2015-01-07 MED ORDER — LIDOCAINE HCL (CARDIAC) 20 MG/ML IV SOLN
INTRAVENOUS | Status: AC
  Filled 2015-01-07: qty 5

## 2015-01-07 MED ORDER — EPHEDRINE SULFATE 50 MG/ML IJ SOLN
INTRAMUSCULAR | Status: AC
  Filled 2015-01-07: qty 1

## 2015-01-07 MED ORDER — ARTIFICIAL TEARS OP OINT
TOPICAL_OINTMENT | OPHTHALMIC | Status: DC | PRN
  Administered 2015-01-07: 1 via OPHTHALMIC

## 2015-01-07 MED ORDER — GLUCAGON HCL RDNA (DIAGNOSTIC) 1 MG IJ SOLR
INTRAMUSCULAR | Status: AC
  Filled 2015-01-07: qty 1

## 2015-01-07 SURGICAL SUPPLY — 43 items
ADH SKN CLS APL DERMABOND .7 (GAUZE/BANDAGES/DRESSINGS) ×1
APPLIER CLIP ROT 10 11.4 M/L (STAPLE) ×2
APR CLP MED LRG 11.4X10 (STAPLE) ×1
BAG SPEC RTRVL LRG 6X4 10 (ENDOMECHANICALS) ×1
BLADE SURG ROTATE 9660 (MISCELLANEOUS) IMPLANT
CANISTER SUCTION 2500CC (MISCELLANEOUS) ×2 IMPLANT
CHLORAPREP W/TINT 26ML (MISCELLANEOUS) ×2 IMPLANT
CLIP APPLIE ROT 10 11.4 M/L (STAPLE) ×1 IMPLANT
COVER MAYO STAND STRL (DRAPES) ×2 IMPLANT
COVER SURGICAL LIGHT HANDLE (MISCELLANEOUS) ×2 IMPLANT
DERMABOND ADVANCED (GAUZE/BANDAGES/DRESSINGS) ×1
DERMABOND ADVANCED .7 DNX12 (GAUZE/BANDAGES/DRESSINGS) IMPLANT
DRAPE C-ARM 42X72 X-RAY (DRAPES) ×2 IMPLANT
DRAPE LAPAROSCOPIC ABDOMINAL (DRAPES) ×2 IMPLANT
DRAPE WARM FLUID 44X44 (DRAPE) ×2 IMPLANT
ELECT REM PT RETURN 9FT ADLT (ELECTROSURGICAL) ×2
ELECTRODE REM PT RTRN 9FT ADLT (ELECTROSURGICAL) ×1 IMPLANT
FILTER SMOKE EVAC LAPAROSHD (FILTER) IMPLANT
GLOVE BIO SURGEON STRL SZ 6 (GLOVE) ×2 IMPLANT
GLOVE BIOGEL PI IND STRL 6.5 (GLOVE) ×1 IMPLANT
GLOVE BIOGEL PI INDICATOR 6.5 (GLOVE) ×1
GOWN STRL REUS W/ TWL LRG LVL3 (GOWN DISPOSABLE) ×2 IMPLANT
GOWN STRL REUS W/TWL 2XL LVL3 (GOWN DISPOSABLE) ×2 IMPLANT
GOWN STRL REUS W/TWL LRG LVL3 (GOWN DISPOSABLE) ×4
KIT BASIN OR (CUSTOM PROCEDURE TRAY) ×2 IMPLANT
KIT ROOM TURNOVER OR (KITS) ×2 IMPLANT
LIQUID BAND (GAUZE/BANDAGES/DRESSINGS) ×2 IMPLANT
NS IRRIG 1000ML POUR BTL (IV SOLUTION) ×2 IMPLANT
PAD ARMBOARD 7.5X6 YLW CONV (MISCELLANEOUS) ×2 IMPLANT
POUCH SPECIMEN RETRIEVAL 10MM (ENDOMECHANICALS) ×2 IMPLANT
SCISSORS LAP 5X35 DISP (ENDOMECHANICALS) ×2 IMPLANT
SET CHOLANGIOGRAPH 5 50 .035 (SET/KITS/TRAYS/PACK) ×2 IMPLANT
SET IRRIG TUBING LAPAROSCOPIC (IRRIGATION / IRRIGATOR) ×2 IMPLANT
SLEEVE ENDOPATH XCEL 5M (ENDOMECHANICALS) ×2 IMPLANT
SPECIMEN JAR SMALL (MISCELLANEOUS) ×2 IMPLANT
SUT MNCRL AB 4-0 PS2 18 (SUTURE) ×2 IMPLANT
TOWEL OR 17X24 6PK STRL BLUE (TOWEL DISPOSABLE) ×2 IMPLANT
TOWEL OR 17X26 10 PK STRL BLUE (TOWEL DISPOSABLE) ×2 IMPLANT
TRAY LAPAROSCOPIC (CUSTOM PROCEDURE TRAY) ×2 IMPLANT
TROCAR XCEL BLUNT TIP 100MML (ENDOMECHANICALS) ×2 IMPLANT
TROCAR XCEL NON-BLD 11X100MML (ENDOMECHANICALS) ×2 IMPLANT
TROCAR XCEL NON-BLD 5MMX100MML (ENDOMECHANICALS) ×2 IMPLANT
TUBING INSUFFLATION (TUBING) ×2 IMPLANT

## 2015-01-07 NOTE — Progress Notes (Signed)
UR completed 

## 2015-01-07 NOTE — Transfer of Care (Signed)
Immediate Anesthesia Transfer of Care Note  Patient: Kevin CrochetKeith L Avans  Procedure(s) Performed: Procedure(s): LAPAROSCOPIC CHOLECYSTECTOMY WITH INTRAOPERATIVE CHOLANGIOGRAM (N/A)  Patient Location: PACU  Anesthesia Type:General  Level of Consciousness: awake, alert , oriented and sedated  Airway & Oxygen Therapy: Patient Spontanous Breathing and Patient connected to nasal cannula oxygen  Post-op Assessment: Report given to RN, Post -op Vital signs reviewed and stable and Patient moving all extremities  Post vital signs: Reviewed and stable  Last Vitals:  Filed Vitals:   01/07/15 0641  BP: 116/69  Pulse: 65  Temp: 36.6 C  Resp: 18    Complications: No apparent anesthesia complications

## 2015-01-07 NOTE — Anesthesia Preprocedure Evaluation (Signed)
Anesthesia Evaluation  Patient identified by MRN, date of birth, ID band Patient awake    Reviewed: Allergy & Precautions, NPO status , Patient's Chart, lab work & pertinent test results  Airway        Dental   Pulmonary          Cardiovascular     Neuro/Psych    GI/Hepatic   Endo/Other  diabetes, Type 2, Oral Hypoglycemic Agents  Renal/GU      Musculoskeletal   Abdominal   Peds  Hematology   Anesthesia Other Findings   Reproductive/Obstetrics                             Anesthesia Physical Anesthesia Plan  ASA: III  Anesthesia Plan: General   Post-op Pain Management:    Induction: Intravenous  Airway Management Planned: Oral ETT  Additional Equipment:   Intra-op Plan:   Post-operative Plan: Extubation in OR  Informed Consent: I have reviewed the patients History and Physical, chart, labs and discussed the procedure including the risks, benefits and alternatives for the proposed anesthesia with the patient or authorized representative who has indicated his/her understanding and acceptance.     Plan Discussed with: CRNA, Anesthesiologist and Surgeon  Anesthesia Plan Comments:         Anesthesia Quick Evaluation

## 2015-01-07 NOTE — Progress Notes (Signed)
Patient ID: Kevin Sanchez, male   DOB: [birth year], 54 y.o.   MRN: [id number]     [facility]      [address] [place], [place] [address]    Phone: [phone] FAX: [phone]     Subjective: No pain.  VSS.  Afebrile.   Objective:  Vital signs:  Filed Vitals:   01/06/15 1800 01/06/15 1844 01/06/15 2133 01/07/15 0641  BP: 117/95 127/76 134/69 116/69  Pulse: 78 56 68 65  Temp:  98.2 F (36.8 C) 97.8 F (36.6 C) 97.9 F (36.6 C)  TempSrc:  Oral Oral   Resp:  '18 18 18  ' Height:  6' (1.829 m)    Weight:  245 lb (111.131 kg)    SpO2: 97% 99% 100% 100%    Last BM Date: 01/06/15  Intake/Output   Yesterday:    This shift:         Physical Exam: General: Pt awake/alert/oriented x4 in no acute distress Chest: cta.  No chest wall pain w good excursion CV:  Pulses intact.  Regular rhythm  Abdomen: Soft.  Nondistended. Non tender.  No evidence of peritonitis.  No incarcerated hernias. Ext:  SCDs BLE.  No mjr edema.  No cyanosis Skin: No petechiae / purpura   Problem List:   Active Problems:   Cholecystitis, acute with cholelithiasis    Results:   Labs: Results for orders placed or performed during the hospital encounter of 01/06/15 (from the past 48 hour(s))  CBC with Differential     Status: Abnormal   Collection Time: 01/06/15 12:51 PM  Result Value Ref Range   WBC 10.9 (H) 4.0 - 10.5 K/uL   RBC 4.83 4.22 - 5.81 MIL/uL   Hemoglobin 15.0 13.0 - 17.0 g/dL   HCT 43.6 39.0 - 52.0 %   MCV 90.3 78.0 - 100.0 fL   MCH 31.1 26.0 - 34.0 pg   MCHC 34.4 30.0 - 36.0 g/dL   RDW 12.5 11.5 - 15.5 %   Platelets 242 150 - 400 K/uL   Neutrophils Relative % 59 43 - 77 %   Neutro Abs 6.4 1.7 - 7.7 K/uL   Lymphocytes Relative 35 12 - 46 %   Lymphs Abs 3.8 0.7 - 4.0 K/uL   Monocytes Relative 6 3 - 12 %   Monocytes Absolute 0.7 0.1 - 1.0 K/uL   Eosinophils Relative 0 0 - 5 %   Eosinophils Absolute 0.0 0.0 - 0.7 K/uL   Basophils Relative 0 0 - 1 %   Basophils Absolute 0.0 0.0 - 0.1 K/uL  Comprehensive metabolic panel     Status: Abnormal   Collection Time: 01/06/15 12:51 PM  Result Value Ref Range   Sodium 139 135 - 145 mmol/L   Potassium 3.8 3.5 - 5.1 mmol/L   Chloride 108 96 - 112 mmol/L   CO2 21 19 - 32 mmol/L   Glucose, Bld 120 (H) 70 - 99 mg/dL   BUN 7 6 - 23 mg/dL   Creatinine, Ser 1.22 0.50 - 1.35 mg/dL   Calcium 8.9 8.4 - 10.5 mg/dL   Total Protein 6.1 6.0 - 8.3 g/dL   Albumin 3.4 (L) 3.5 - 5.2 g/dL   AST 16 0 - 37 U/L   ALT 14 0 - 53 U/L   Alkaline Phosphatase 36 (L) 39 - 117 U/L   Total Bilirubin 0.6 0.3 - 1.2 mg/dL   GFR calc non Af Amer 65 (L) >90 mL/min  GFR calc Af Amer 75 (L) >90 mL/min    Comment: (NOTE) The eGFR has been calculated using the CKD EPI equation. This calculation has not been validated in all clinical situations. eGFR's persistently <90 mL/min signify possible Chronic Kidney Disease.    Anion gap 10 5 - 15  Lipase, blood     Status: None   Collection Time: 01/06/15 12:51 PM  Result Value Ref Range   Lipase 23 11 - 59 U/L  Glucose, capillary     Status: Abnormal   Collection Time: 01/06/15  6:37 PM  Result Value Ref Range   Glucose-Capillary 130 (H) 70 - 99 mg/dL   Comment 1 Notify RN   Surgical pcr screen     Status: None   Collection Time: 01/06/15  9:15 PM  Result Value Ref Range   MRSA, PCR NEGATIVE NEGATIVE   Staphylococcus aureus NEGATIVE NEGATIVE    Comment:        The Xpert SA Assay (FDA approved for NASAL specimens in patients over 75 years of age), is one component of a comprehensive surveillance program.  Test performance has been validated by [facility] for patients greater than or equal to 31 year old. It is not intended to diagnose infection nor to guide or monitor treatment.   Glucose, capillary     Status: Abnormal   Collection Time: 01/06/15  9:56 PM  Result Value Ref Range   Glucose-Capillary 207 (H) 70 - 99 mg/dL   Comment  1 Notify RN    Comment 2 Documented in Char   Comprehensive metabolic panel     Status: Abnormal   Collection Time: 01/07/15  5:35 AM  Result Value Ref Range   Sodium 140 135 - 145 mmol/L   Potassium 3.7 3.5 - 5.1 mmol/L   Chloride 109 96 - 112 mmol/L   CO2 25 19 - 32 mmol/L   Glucose, Bld 127 (H) 70 - 99 mg/dL   BUN <5 (L) 6 - 23 mg/dL   Creatinine, Ser 1.23 0.50 - 1.35 mg/dL   Calcium 8.3 (L) 8.4 - 10.5 mg/dL   Total Protein 5.0 (L) 6.0 - 8.3 g/dL   Albumin 2.7 (L) 3.5 - 5.2 g/dL   AST 12 0 - 37 U/L   ALT 12 0 - 53 U/L   Alkaline Phosphatase 31 (L) 39 - 117 U/L   Total Bilirubin 0.7 0.3 - 1.2 mg/dL   GFR calc non Af Amer 64 (L) >90 mL/min   GFR calc Af Amer 75 (L) >90 mL/min    Comment: (NOTE) The eGFR has been calculated using the CKD EPI equation. This calculation has not been validated in all clinical situations. eGFR's persistently <90 mL/min signify possible Chronic Kidney Disease.    Anion gap 6 5 - 15  CBC     Status: Abnormal (Preliminary result)   Collection Time: 01/07/15  5:35 AM  Result Value Ref Range   WBC 8.0 4.0 - 10.5 K/uL   RBC 4.30 4.22 - 5.81 MIL/uL   Hemoglobin 13.4 13.0 - 17.0 g/dL   HCT 38.5 (L) 39.0 - 52.0 %   MCV 89.5 78.0 - 100.0 fL   MCH 31.2 26.0 - 34.0 pg   MCHC 34.8 30.0 - 36.0 g/dL   RDW 12.6 11.5 - 15.5 %   Platelets PENDING 150 - 400 K/uL  Glucose, capillary     Status: Abnormal   Collection Time: 01/07/15  7:39 AM  Result Value Ref Range   Glucose-Capillary 137 (  H) 70 - 99 mg/dL    Imaging / Studies: US Abdomen Complete  01/06/2015   CLINICAL DATA:  Abdominal pain  EXAM: ULTRASOUND ABDOMEN COMPLETE  COMPARISON:  12/11/2014.  FINDINGS: Gallbladder: Gallstones are identified within the gallbladder fundus. The gallbladder wall is mildly thickened measuring 3 mm. Negative sonographic Murphy's sign.  Common bile duct: Diameter: 4.8 mm.  Liver: No focal lesion identified. The liver exhibits increased echogenicity compatible with hepatic  steatosis.  IVC: No abnormality visualized.  Pancreas: Visualized portion unremarkable.  Spleen: Size and appearance within normal limits.  Right Kidney: Length: 12.2 cm. Echogenicity within normal limits. No mass or hydronephrosis visualized.  Left Kidney: Length: 12.4 cm. Echogenicity within normal limits. No mass or hydronephrosis visualized.  Abdominal aorta: No aneurysm visualized.  Other findings: None.  IMPRESSION: 1. Gallstones. The gallbladder wall is upper limits of normal in thickness measuring 3 mm. Cannot rule out mild/early cholecystitis. If further imaging is clinically indicated a hepatobiliary scan may be helpful to assess the patency of the cystic duct. 2. Hepatic steatosis.   Electronically Signed   By: Kerby Moors M.D.   On: 01/06/2015 15:59   Dg Hip Unilat With Pelvis 2-3 Views Left  01/06/2015   CLINICAL DATA:  Chronic left anterior hip/groin pain that has been ongoing for over 6 years and has been progressively worsening; pt states he has difficulty walking on it due to pain; pt states he had an injury many years ago due to playing football and was supposed to have surgery but never did; pt states he has been told he had spurring also.  EXAM: LEFT HIP (WITH PELVIS) 2-3 VIEWS  COMPARISON:  08/07/2010  FINDINGS: Exam demonstrates moderate degenerative changes of the left hip and mild degenerative change of the right hip without significant change. No acute fracture or dislocation. Remainder of the exam is unchanged.  IMPRESSION: No acute findings.  Moderate osteoarthritic change of the left hip and mild osteoarthritic change of the right hip.   Electronically Signed   By: Marin Olp M.D.   On: 01/06/2015 15:26    Medications / Allergies:  Scheduled Meds: . cefTRIAXone (ROCEPHIN)  IV  2 g Intravenous Q24H  . insulin aspart  0-15 Units Subcutaneous TID WC  . pantoprazole (PROTONIX) IV  40 mg Intravenous QHS   Continuous Infusions: . 0.9 % NaCl with KCl 20 mEq / L 100 mL/hr at  01/06/15 2031   PRN Meds:.diphenhydrAMINE **OR** diphenhydrAMINE, morphine injection, ondansetron  Antibiotics: Anti-infectives    Start     Dose/Rate Route Frequency Ordered Stop   01/06/15 1930  cefTRIAXone (ROCEPHIN) 2 g in dextrose 5 % 50 mL IVPB - Premix     2 g 100 mL/hr over 30 Minutes Intravenous Every 24 hours 01/06/15 1836          Assessment/Plan Early acute cholecystitis -to OR this AM for laparoscopic cholecystectomy with IOC -Pt has been NPO, on rocephin, not on blood thinners, consent signed -IVF -pain control -SCD, lovenox post op -teach IS  DM2 -CBG, SSI -change IVF to D5 to prevent hypoglycemia   Erby Pian, ANP-BC [facility]   01/07/2015 8:46 AM

## 2015-01-07 NOTE — Anesthesia Postprocedure Evaluation (Signed)
  Anesthesia Post-op Note  Patient: Kevin Sanchez  Procedure(s) Performed: Procedure(s): LAPAROSCOPIC CHOLECYSTECTOMY WITH INTRAOPERATIVE CHOLANGIOGRAM (N/A)  Patient Location: PACU  Anesthesia Type:General  Level of Consciousness: awake, alert , oriented and patient cooperative  Airway and Oxygen Therapy: Patient Spontanous Breathing  Post-op Pain: mild  Post-op Assessment: Post-op Vital signs reviewed, Patient's Cardiovascular Status Stable, Respiratory Function Stable, Patent Airway, No signs of Nausea or vomiting and Pain level controlled  Post-op Vital Signs: stable  Last Vitals:  Filed Vitals:   01/07/15 1212  BP: 120/68  Pulse: 66  Temp: 36.6 C  Resp: 14    Complications: No apparent anesthesia complications

## 2015-01-07 NOTE — Consult Note (Signed)
Consultation  Referring Provider:  Dr. Donell Beers Primary Care Physician:  No primary care provider on file. Primary Gastroenterologist:  unassigned  Reason for Consultation:  +IOC  HPI: Kevin Sanchez is a 54 y.o. male with PMH of DM who presented to ED with abd pain.  RUQ assoc with n/v.  Did not have heartburn, dysphagia, diarrhea, rectal bleeding or melena.  Prior similar episodes also led to ED visit in [facility] and he was told he had gallstones.  No jaundice or itching.  No LE edema.    He had mild elevation in WBC count, LFTs were normal, lipase was normal. US showed gallstones and thickening in the gallbladder wall  Dr. Laurelyn Sickle performed lap chole today and IOC suggested distal CBD stone.  We are consulted for ERCP  He had previous colonoscopy in [place] at age 67, recalled this as normal.  No GI MD in [place]   Past Medical History  Diagnosis Date  . Diabetes mellitus without complication     History reviewed. No pertinent past surgical history.  Prior to Admission medications   Medication Sig Start Date End Date Taking? Authorizing Provider  ibuprofen (ADVIL,MOTRIN) 200 MG tablet Take 800 mg by mouth every 6 (six) hours as needed.   Yes Historical Provider, MD  metFORMIN (GLUCOPHAGE) 1000 MG tablet Take 1,000 mg by mouth 2 (two) times daily with a meal.   Yes Historical Provider, MD    Current Facility-Administered Medications  Medication Dose Route Frequency Provider Last Rate Last Dose  . cefTRIAXone (ROCEPHIN) 2 g in dextrose 5 % 50 mL IVPB - Premix  2 g Intravenous Q24H Gaynelle Adu, MD   2 g at 01/06/15 2030  . dextrose 5 %-0.9 % sodium chloride infusion   Intravenous Continuous Emina Riebock, NP 100 mL/hr at 01/07/15 0905    . diphenhydrAMINE (BENADRYL) injection 12.5-25 mg  12.5-25 mg Intravenous Q6H PRN Gaynelle Adu, MD       Or  . diphenhydrAMINE (BENADRYL) 12.5 MG/5ML elixir 12.5-25 mg  12.5-25 mg Oral Q6H PRN Gaynelle Adu, MD      . HYDROmorphone (DILAUDID) 1 MG/ML  injection           . HYDROmorphone (DILAUDID) 1 MG/ML injection           . insulin aspart (novoLOG) injection 0-15 Units  0-15 Units Subcutaneous TID WC Gaynelle Adu, MD   2 Units at 01/07/15 [phone]  . morphine 2 MG/ML injection 1-4 mg  1-4 mg Intravenous Q1H PRN Gaynelle Adu, MD      . ondansetron [facility]) injection 4 mg  4 mg Intravenous Q6H PRN Gaynelle Adu, MD      . pantoprazole (PROTONIX) injection 40 mg  40 mg Intravenous QHS Gaynelle Adu, MD   40 mg at 01/06/15 2050    Allergies as of 01/06/2015  . (No Known Allergies)    History reviewed. No pertinent family history.  History   Social History  . Marital Status: Divorced    Spouse Name: N/A  . Number of Children: N/A  . Years of Education: N/A   Occupational History  . Not on file.   Social History Main Topics  . Smoking status: Never Smoker   . Smokeless tobacco: Not on file  . Alcohol Use: No  . Drug Use: No  . Sexual Activity: Not on file   Other Topics Concern  . Not on file   Social History Narrative    Review of Systems: As per HPI, otherwise  negative  Physical Exam: Vital signs in last 24 hours: Temp:  [97.8 F (36.6 C)-98.2 F (36.8 C)] 98.1 F (36.7 C) (03/13 1337) Pulse Rate:  [56-96] 96 (03/13 1337) Resp:  [12-18] 16 (03/13 1337) BP: (114-134)/(67-95) 125/75 mmHg (03/13 1337) SpO2:  [93 %-100 %] 96 % (03/13 1337) Weight:  [245 lb (111.131 kg)] 245 lb (111.131 kg) (03/12 1844) Last BM Date: 01/06/15 Gen: awake, alert, NAD HEENT: anicteric, op clear CV: RRR, no mrg Pulm: CTA b/l Abd: soft, tender RUQ,  +BS throughout Ext: no c/c/e Neuro: nonfocal   Intake/Output from previous day:   Intake/Output this shift: Total I/O In: 1400 [I.V.:1400] Out: 285 [Urine:275; Blood:10]  Lab Results:  Recent Labs  01/06/15 1251 01/07/15 0535  WBC 10.9* 8.0  HGB 15.0 13.4  HCT 43.6 38.5*  PLT 242 185   BMET  Recent Labs  01/06/15 1251 01/07/15 0535  NA 139 140  K 3.8 3.7  CL 108 109    CO2 21 25  GLUCOSE 120* 127*  BUN 7 <5*  CREATININE 1.22 1.23  CALCIUM 8.9 8.3*   LFT  Recent Labs  01/07/15 0535  PROT 5.0*  ALBUMIN 2.7*  AST 12  ALT 12  ALKPHOS 31*  BILITOT 0.7    Studies/Results: Abd US reviewed IOC reviewed  IMPRESSION/PLAN:  54 yo with symptomatic gallstones and possible early cholecystitis now s/p lap chole with possible distal CBD stone.  1. ?CBD stone -- LFTs have been normal and pt is now s/p lap chole.  No complications.  Dr. Marina GoodellPerry who will be biliary endoscopist tomorrow has reviewed the IOC.  He requested MRCP 1st to confirm CBD stone. If present he will need ERCP. I discussed ERCP with him and his significant other including the risks, benefits, and alternative.  Risks include pancreatitis, bleeding, infection, perforation.   --Await MRCP (ordered today) --If positive then ERCP      Azilee Pirro M  01/07/2015, 2:45 PM

## 2015-01-07 NOTE — Anesthesia Procedure Notes (Signed)
Procedure Name: Intubation Date/Time: 01/07/2015 10:42 AM Performed by: Fransisca KaufmannMEYER, Orvie Caradine E Pre-anesthesia Checklist: Patient identified, Emergency Drugs available, Suction available, Patient being monitored and Timeout performed Patient Re-evaluated:Patient Re-evaluated prior to inductionOxygen Delivery Method: Circle system utilized Preoxygenation: Pre-oxygenation with 100% oxygen Intubation Type: IV induction Ventilation: Mask ventilation without difficulty Laryngoscope Size: Miller and 3 Grade View: Grade I Tube type: Oral Tube size: 7.5 mm Number of attempts: 1 Airway Equipment and Method: Stylet Placement Confirmation: ETT inserted through vocal cords under direct vision,  positive ETCO2 and breath sounds checked- equal and bilateral Secured at: 24 cm Tube secured with: Tape Dental Injury: Teeth and Oropharynx as per pre-operative assessment

## 2015-01-07 NOTE — Op Note (Signed)
Laparoscopic Cholecystectomy with IOC Procedure Note  Indications: This patient presents with biliary colic and will undergo laparoscopic cholecystectomy.  Pre-operative Diagnosis: biliary colic  Post-operative Diagnosis: chronic cholecystitis with calculi, choledocholithiasis  Surgeon: ZOXWRU,EAVWUBYERLY,Jeena Arnett   Anesthesia: General endotracheal anesthesia and local  ASA Class: 3  Procedure Details  The patient was seen again in the Holding Room. The risks, benefits, complications, treatment options, and expected outcomes were discussed with the patient. The possibilities of  bleeding, recurrent infection, damage to nearby structures, the need for additional procedures, failure to diagnose a condition, the possible need to convert to an open procedure, and creating a complication requiring transfusion or operation were discussed with the patient. The likelihood of improving the patient's symptoms with return to their baseline status is good.    The patient and/or family concurred with the proposed plan, giving informed consent. The site of surgery properly noted. The patient was taken to Operating Room, and the procedure verified as Laparoscopic Cholecystectomy with Intraoperative Cholangiogram. A Time Out was held and the above information confirmed.  Prior to the induction of general anesthesia, antibiotic prophylaxis was administered. General endotracheal anesthesia was then administered and tolerated well. After the induction, the abdomen was prepped with Chloraprep and draped in the sterile fashion. The patient was positioned in the supine position.  Local anesthetic agent was injected into the skin near the umbilicus and an incision made. We dissected down to the abdominal fascia with blunt dissection.  The fascia was incised vertically and we entered the peritoneal cavity bluntly.  A pursestring suture of 0-Vicryl was placed around the fascial opening.  The Hasson cannula was inserted and secured  with the stay suture.  Pneumoperitoneum was then created with CO2 and tolerated well without any adverse changes in the patient's vital signs. An 11-mm port was placed in the subxiphoid position.  Two 5-mm ports were placed in the right upper quadrant. All skin incisions were infiltrated with a local anesthetic agent before making the incision and placing the trocars.   We positioned the patient in reverse Trendelenburg, tilted slightly to the patient's left.  The gallbladder was identified, the fundus grasped and retracted cephalad. Adhesions were lysed bluntly and with the electrocautery where indicated, taking care not to injure any adjacent organs or viscus. The infundibulum was grasped and retracted laterally, exposing the peritoneum overlying the triangle of Calot. This was then divided and exposed in a blunt fashion. A critical view of the cystic duct and cystic artery was obtained.  The cystic duct was clearly identified and bluntly dissected circumferentially. The cystic duct was ligated with a clip distally.   An incision was made in the cystic duct and the [facility] chol[facility] catheter introduced. The catheter was secured using a clip. A cholangiogram was then performed, demonstrating good filling of common bile duct, right and left hepatic duct, meniscal defect in the distal common bile duct, and delayed filling into the duodenum.  The cystic duct was then ligated with clips and divided. The cystic artery was identified, dissected free, ligated with clips and divided as well.   The gallbladder was dissected from the liver bed in retrograde fashion with the electrocautery. The gallbladder was removed and placed in an Endocatch bag.  The gallbladder and Endocatch bag were then removed through the umbilical port site.  The liver bed was irrigated and inspected. Hemostasis was achieved with the electrocautery. Copious irrigation was utilized and was repeatedly aspirated until clear.    We again  inspected the right  upper quadrant for hemostasis.  Pneumoperitoneum was released as we removed the trocars.   The pursestring suture was used to close the umbilical fascia.  4-0 Monocryl was used to close the skin.   The skin was cleaned and dry, and Dermabond was applied. The patient was then extubated and brought to the recovery room in stable condition. Instrument, sponge, and needle counts were correct at closure and at the conclusion of the case.   Findings: Partially intrahepatic gallbladder, partially obstructed common bile duct  Estimated Blood Loss: 25         Drains: none          Specimens: Gallbladder to pathology       Complications: None; patient tolerated the procedure well.         Disposition: PACU - hemodynamically stable.         Condition: stable

## 2015-01-08 ENCOUNTER — Observation Stay (HOSPITAL_COMMUNITY): Payer: Self-pay | Admitting: Anesthesiology

## 2015-01-08 ENCOUNTER — Observation Stay (HOSPITAL_COMMUNITY): Payer: MEDICAID | Admitting: Anesthesiology

## 2015-01-08 ENCOUNTER — Observation Stay (HOSPITAL_COMMUNITY): Payer: Self-pay

## 2015-01-08 ENCOUNTER — Encounter (HOSPITAL_COMMUNITY): Admission: EM | Disposition: A | Discharge status: Home / Self Care | Payer: Self-pay | Source: Home / Self Care

## 2015-01-08 ENCOUNTER — Encounter (HOSPITAL_COMMUNITY): Payer: Self-pay | Admitting: *Deleted

## 2015-01-08 DIAGNOSIS — K8050 Calculus of bile duct without cholangitis or cholecystitis without obstruction: Secondary | ICD-10-CM

## 2015-01-08 DIAGNOSIS — R932 Abnormal findings on diagnostic imaging of liver and biliary tract: Secondary | ICD-10-CM

## 2015-01-08 HISTORY — PX: ERCP: SHX5425

## 2015-01-08 LAB — GLUCOSE, CAPILLARY
GLUCOSE-CAPILLARY: 161 mg/dL — AB (ref 70–99)
GLUCOSE-CAPILLARY: 106 mg/dL — AB (ref 70–99)
Glucose-Capillary: 136 mg/dL — ABNORMAL HIGH (ref 70–99)

## 2015-01-08 SURGERY — ERCP, WITH INTERVENTION IF INDICATED
Anesthesia: General

## 2015-01-08 MED ORDER — FENTANYL CITRATE 0.05 MG/ML IJ SOLN
INTRAMUSCULAR | Status: DC | PRN
  Administered 2015-01-08 (×2): 50 ug via INTRAVENOUS

## 2015-01-08 MED ORDER — ROCURONIUM BROMIDE 100 MG/10ML IV SOLN
INTRAVENOUS | Status: DC | PRN
  Administered 2015-01-08: 25 mg via INTRAVENOUS

## 2015-01-08 MED ORDER — MIDAZOLAM HCL 5 MG/5ML IJ SOLN
INTRAMUSCULAR | Status: DC | PRN
  Administered 2015-01-08: 1 mg via INTRAVENOUS

## 2015-01-08 MED ORDER — LIDOCAINE HCL (CARDIAC) 20 MG/ML IV SOLN
INTRAVENOUS | Status: DC | PRN
  Administered 2015-01-08: 50 mg via INTRAVENOUS

## 2015-01-08 MED ORDER — OXYCODONE-ACETAMINOPHEN 5-325 MG PO TABS
1.0000 | ORAL_TABLET | ORAL | Status: DC | PRN
  Administered 2015-01-08 – 2015-01-09 (×3): 2 via ORAL
  Filled 2015-01-08 (×3): qty 2

## 2015-01-08 MED ORDER — PHENYLEPHRINE HCL 10 MG/ML IJ SOLN
INTRAMUSCULAR | Status: DC | PRN
  Administered 2015-01-08: 80 ug via INTRAVENOUS
  Administered 2015-01-08 (×2): 40 ug via INTRAVENOUS
  Administered 2015-01-08 (×3): 80 ug via INTRAVENOUS

## 2015-01-08 MED ORDER — HYDROMORPHONE HCL 1 MG/ML IJ SOLN: 0.2500 mg | INTRAMUSCULAR | Status: DC | PRN

## 2015-01-08 MED ORDER — NEOSTIGMINE METHYLSULFATE 10 MG/10ML IV SOLN
INTRAVENOUS | Status: DC | PRN
  Administered 2015-01-08: 5 mg via INTRAVENOUS

## 2015-01-08 MED ORDER — LACTATED RINGERS IV SOLN
INTRAVENOUS | Status: DC | PRN
  Administered 2015-01-08: 12:00:00 via INTRAVENOUS

## 2015-01-08 MED ORDER — GLYCOPYRROLATE 0.2 MG/ML IJ SOLN
INTRAMUSCULAR | Status: DC | PRN
  Administered 2015-01-08: .8 mg via INTRAVENOUS

## 2015-01-08 MED ORDER — PROMETHAZINE HCL 25 MG/ML IJ SOLN
6.2500 mg | INTRAMUSCULAR | Status: DC | PRN
Start: 2015-01-08 — End: 2015-01-08

## 2015-01-08 MED ORDER — SODIUM CHLORIDE 0.9 % IV SOLN: INTRAVENOUS | Status: DC

## 2015-01-08 MED ORDER — ONDANSETRON HCL 4 MG/2ML IJ SOLN
INTRAMUSCULAR | Status: DC | PRN
Start: 2015-01-08 — End: 2015-01-08
  Administered 2015-01-08: 4 mg via INTRAVENOUS

## 2015-01-08 MED ORDER — GLUCAGON HCL RDNA (DIAGNOSTIC) 1 MG IJ SOLR
INTRAMUSCULAR | Status: DC | PRN
  Administered 2015-01-08: .5 mg via INTRAVENOUS

## 2015-01-08 MED ORDER — LACTATED RINGERS IV SOLN
INTRAVENOUS | Status: DC
  Administered 2015-01-08: 1000 mL via INTRAVENOUS

## 2015-01-08 MED ORDER — IOHEXOL 300 MG/ML  SOLN
INTRAMUSCULAR | Status: DC | PRN
  Administered 2015-01-08: 25 mL via INTRAVENOUS

## 2015-01-08 MED ORDER — INDOMETHACIN 50 MG RE SUPP
50.0000 mg | RECTAL | Status: AC
  Administered 2015-01-08: 50 mg via RECTAL
  Filled 2015-01-08: qty 1

## 2015-01-08 MED ORDER — PROPOFOL 10 MG/ML IV BOLUS
INTRAVENOUS | Status: DC | PRN
  Administered 2015-01-08: 200 mg via INTRAVENOUS

## 2015-01-08 MED ORDER — INDOMETHACIN 50 MG RE SUPP
50.0000 mg | RECTAL | Status: AC
  Administered 2015-01-08: 50 mg via RECTAL
  Filled 2015-01-08 (×2): qty 1

## 2015-01-08 NOTE — Anesthesia Postprocedure Evaluation (Signed)
  Anesthesia Post-op Note  Patient: Kevin Sanchez  Procedure(s) Performed: Procedure(s): ENDOSCOPIC RETROGRADE CHOLANGIOPANCREATOGRAPHY (ERCP) (N/A)  Patient Location: PACU and Endoscopy Unit  Anesthesia Type:General  Level of Consciousness: awake and patient cooperative  Airway and Oxygen Therapy: Patient Spontanous Breathing and Patient connected to nasal cannula oxygen  Post-op Pain: none  Post-op Assessment: Post-op Vital signs reviewed, Patient's Cardiovascular Status Stable, Respiratory Function Stable, Patent Airway and No signs of Nausea or vomiting  Post-op Vital Signs: Reviewed and stable  Last Vitals:  Filed Vitals:   01/08/15 1131  BP: 122/79  Pulse:   Temp: 36.8 C  Resp: 13    Complications: No apparent anesthesia complications

## 2015-01-08 NOTE — Op Note (Signed)
[facility] [address] [place], [address]   ERCP PROCEDURE REPORT        EXAM DATE: 01/08/2015  PATIENT NAME:          Kevin Sanchez, Kevin Sanchez          MR #: [id number] BIRTHDATE:       [birth year]     VISIT #:     [phone] ATTENDING:     Roxy Cedar, MD     STATUS:     outpatient ASSISTANT:      Waunita Schooner and Nilsa Nutting   INDICATIONS:  The patient is a 54 yr old male here for an ERCP due to established bile duct stone(s)on IOC and MRCP. PROCEDURE PERFORMED:     ERCP with sphincterotomy/papillotomy ERCP with removal of calculus/calculi MEDICATIONS:     Per Anesthesia   (general anesthesia)  CONSENT: The patient understands the risks and benefits of the procedure and understands that these risks include, but are not limited to: sedation, allergic reaction, infection, perforation and/or bleeding. Alternative means of evaluation and treatment include, among others: physical exam, x-rays, and/or surgical intervention. The patient elects to proceed with this endoscopic procedure.  DESCRIPTION OF PROCEDURE: During intra-op preparation period all mechanical & medical equipment was checked for proper function. Hand hygiene and appropriate measures for infection prevention was taken. After the risks, benefits and alternatives of the procedure were thoroughly explained, Informed was verified, confirmed and timeout was successfully executed by the treatment team. With the patient in left semi-prone position, medications were administered intravenously.The Pentax Ercp Scope [id number] was passed from the mouth into the esophagus and further advanced from the esophagus into the stomach. From stomach scope was directed to the second portion of the duodenum.  Major papilla was aligned with the duodenoscope. The scope position was confirmed fluoroscopically. Rest of the findings/therapeutics are given below. The scope was then completely  withdrawn from the patient and the procedure completed. The pulse, BP, and O2 saturation were monitored and documented by the physician and the nursing staff throughout the entire procedure. The patient was cared for as planned according to standard protocol. The patient was then discharged to recovery in stable condition and with appropriate post procedure care.  ENDOSCOPY: The esophagus was not evaluated.  The stomach revealed NG tube trauma but was otherwise normal.  The duodenum was normal as was the major ampulla.  The minor ampulla was not sought X-ray: Scott radiograph of the abdomen with the endoscope in position revealed cholecystectomy clips.  Injection of small amounts of contrast on the first attempt opacify the distal bile duct.  The common bile duct was then deeply selectively cannulated with a guidewire.  Opacification of the biliary tree revealed a nondilated system with a small distal filling defect.  There was no opacification or manipulation of the pancreatic duct THERAPY: A biliary sphincterotomy was made by cutting over the guidewire in the 12:00 orientation using the ERBE system. Sphincterotomy size was deemed moderate.  The balloon was used to extract 2 tiny cholesterol stones.  Excellent drainage.    ADVERSE EVENT:     There were no complications. IMPRESSIONS:     1. Choledocholithiasis status post ERC with sphincterotomy and stone extraction  RECOMMENDATIONS:     1. Standard postprocedure Observation 2. Indomethacin suppositories given 3. Anticipate discharge in a.m. REPEAT EXAM:   ___________________________________ Roxy Cedar, MD eSigned:  Roxy Cedar, MD 01/08/2015 1:40 PM  cc:  Almond LintFaera Byerly, M.D., the patient  CPT CODES: ICD9 CODES:  The ICD and CPT codes recommended by this software are interpretations from the data that the clinical staff has captured with the software.  The verification of the translation of this report to the ICD  and CPT codes and modifiers is the sole responsibility of the health care institution and practicing physician where this report was generated.  [employer] will not be held responsible for the validity of the ICD and CPT codes included on this report.  AMA assumes no liability for data contained or not contained herein. CPT is a Publishing rights managerregistered trademark of the [employer].   PATIENT NAME:  Kevin Sanchez, Kevin Sanchez MR#: [id number]

## 2015-01-08 NOTE — Progress Notes (Signed)
UR completed 

## 2015-01-08 NOTE — Progress Notes (Signed)
Patient ID: Kevin Sanchez, male   DOB: July 18, 1961, 54 y.o.   MRN: [id number]     [facility]      [address] [place], [place] [address]    Phone: [phone] FAX: [phone]     Subjective: abd pain, improved.  No n/v.  GI following.  MRCP +for stones in CBD.  Afebrile.  VSS.    Objective:  Vital signs:  Filed Vitals:   01/07/15 1747 01/07/15 2139 01/08/15 0230 01/08/15 0544  BP: 130/78 113/74 127/69 109/71  Pulse: 79 82 59 87  Temp: 98 F (36.7 C) 98.3 F (36.8 C) 98.5 F (36.9 C) 98.2 F (36.8 C)  TempSrc: Oral Oral Oral Oral  Resp: _0 Height:      Weight:      SpO2: 97% 96% 97% 97%    Last BM Date: 01/06/15  Intake/Output   Yesterday:  03/13 0701 - 03/14 0700 In: 4818 [P.O.:476; I.V.:2867] Out: 285 [Urine:275; Blood:10] This shift: I/O last 3 completed shifts: In: 5631 [P.O.:476; I.V.:2867] Out: 285 [Urine:275; Blood:10]     Physical Exam: General: Pt awake/alert/oriented x4 in no acute distress Chest: cta.  No chest wall pain w good excursion CV:  Pulses intact.  Regular rhythm MS: Normal AROM mjr joints.  No obvious deformity Abdomen: Soft.  Nondistended. TTP around incisions.  Incisions are c/d/i.   No evidence of peritonitis.  No incarcerated hernias. Ext:  SCDs BLE.  No mjr edema.  No cyanosis Skin: No petechiae / purpura   Problem List:   Active Problems:   Cholecystitis, acute with cholelithiasis    Results:   Labs: Results for orders placed or performed during the hospital encounter of 01/06/15 (from the past 48 hour(s))  CBC with Differential     Status: Abnormal   Collection Time: 01/06/15 12:51 PM  Result Value Ref Range   WBC 10.9 (H) 4.0 - 10.5 K/uL   RBC 4.83 4.22 - 5.81 MIL/uL   Hemoglobin 15.0 13.0 - 17.0 g/dL   HCT 43.6 39.0 - 52.0 %   MCV 90.3 78.0 - 100.0 fL   MCH 31.1 26.0 - 34.0 pg   MCHC 34.4 30.0 - 36.0 g/dL   RDW 12.5 11.5 - 15.5 %   Platelets 242 150  - 400 K/uL   Neutrophils Relative % 59 43 - 77 %   Neutro Abs 6.4 1.7 - 7.7 K/uL   Lymphocytes Relative 35 12 - 46 %   Lymphs Abs 3.8 0.7 - 4.0 K/uL   Monocytes Relative 6 3 - 12 %   Monocytes Absolute 0.7 0.1 - 1.0 K/uL   Eosinophils Relative 0 0 - 5 %   Eosinophils Absolute 0.0 0.0 - 0.7 K/uL   Basophils Relative 0 0 - 1 %   Basophils Absolute 0.0 0.0 - 0.1 K/uL  Comprehensive metabolic panel     Status: Abnormal   Collection Time: 01/06/15 12:51 PM  Result Value Ref Range   Sodium 139 135 - 145 mmol/L   Potassium 3.8 3.5 - 5.1 mmol/L   Chloride 108 96 - 112 mmol/L   CO2 21 19 - 32 mmol/L   Glucose, Bld 120 (H) 70 - 99 mg/dL   BUN 7 6 - 23 mg/dL   Creatinine, Ser 1.22 0.50 - 1.35 mg/dL   Calcium 8.9 8.4 - 10.5 mg/dL   Total Protein 6.1 6.0 - 8.3 g/dL   Albumin 3.4 (L) 3.5 - 5.2  g/dL   AST 16 0 - 37 U/L   ALT 14 0 - 53 U/L   Alkaline Phosphatase 36 (L) 39 - 117 U/L   Total Bilirubin 0.6 0.3 - 1.2 mg/dL   GFR calc non Af Amer 65 (L) >90 mL/min   GFR calc Af Amer 75 (L) >90 mL/min    Comment: (NOTE) The eGFR has been calculated using the CKD EPI equation. This calculation has not been validated in all clinical situations. eGFR's persistently <90 mL/min signify possible Chronic Kidney Disease.    Anion gap 10 5 - 15  Lipase, blood     Status: None   Collection Time: 01/06/15 12:51 PM  Result Value Ref Range   Lipase 23 11 - 59 U/L  Glucose, capillary     Status: Abnormal   Collection Time: 01/06/15  6:37 PM  Result Value Ref Range   Glucose-Capillary 130 (H) 70 - 99 mg/dL   Comment 1 Notify RN   Surgical pcr screen     Status: None   Collection Time: 01/06/15  9:15 PM  Result Value Ref Range   MRSA, PCR NEGATIVE NEGATIVE   Staphylococcus aureus NEGATIVE NEGATIVE    Comment:        The Xpert SA Assay (FDA approved for NASAL specimens in patients over 24 years of age), is one component of a comprehensive surveillance program.  Test performance has been validated  by [facility] for patients greater than or equal to 74 year old. It is not intended to diagnose infection nor to guide or monitor treatment.   Glucose, capillary     Status: Abnormal   Collection Time: 01/06/15  9:56 PM  Result Value Ref Range   Glucose-Capillary 207 (H) 70 - 99 mg/dL   Comment 1 Notify RN    Comment 2 Documented in Char   Comprehensive metabolic panel     Status: Abnormal   Collection Time: 01/07/15  5:35 AM  Result Value Ref Range   Sodium 140 135 - 145 mmol/L   Potassium 3.7 3.5 - 5.1 mmol/L   Chloride 109 96 - 112 mmol/L   CO2 25 19 - 32 mmol/L   Glucose, Bld 127 (H) 70 - 99 mg/dL   BUN <5 (L) 6 - 23 mg/dL   Creatinine, Ser 1.23 0.50 - 1.35 mg/dL   Calcium 8.3 (L) 8.4 - 10.5 mg/dL   Total Protein 5.0 (L) 6.0 - 8.3 g/dL   Albumin 2.7 (L) 3.5 - 5.2 g/dL   AST 12 0 - 37 U/L   ALT 12 0 - 53 U/L   Alkaline Phosphatase 31 (L) 39 - 117 U/L   Total Bilirubin 0.7 0.3 - 1.2 mg/dL   GFR calc non Af Amer 64 (L) >90 mL/min   GFR calc Af Amer 75 (L) >90 mL/min    Comment: (NOTE) The eGFR has been calculated using the CKD EPI equation. This calculation has not been validated in all clinical situations. eGFR's persistently <90 mL/min signify possible Chronic Kidney Disease.    Anion gap 6 5 - 15  CBC     Status: Abnormal   Collection Time: 01/07/15  5:35 AM  Result Value Ref Range   WBC 8.0 4.0 - 10.5 K/uL   RBC 4.30 4.22 - 5.81 MIL/uL   Hemoglobin 13.4 13.0 - 17.0 g/dL   HCT 38.5 (L) 39.0 - 52.0 %   MCV 89.5 78.0 - 100.0 fL   MCH 31.2 26.0 - 34.0 pg   MCHC  34.8 30.0 - 36.0 g/dL   RDW 12.6 11.5 - 15.5 %   Platelets 185 150 - 400 K/uL    Comment: REPEATED TO VERIFY DELTA CHECK NOTED   Glucose, capillary     Status: Abnormal   Collection Time: 01/07/15  7:39 AM  Result Value Ref Range   Glucose-Capillary 137 (H) 70 - 99 mg/dL  Glucose, capillary     Status: Abnormal   Collection Time: 01/07/15 12:35 PM  Result Value Ref Range   Glucose-Capillary 163 (H)  70 - 99 mg/dL  Glucose, capillary     Status: Abnormal   Collection Time: 01/07/15  5:41 PM  Result Value Ref Range   Glucose-Capillary 125 (H) 70 - 99 mg/dL  Glucose, capillary     Status: Abnormal   Collection Time: 01/07/15  9:40 PM  Result Value Ref Range   Glucose-Capillary 139 (H) 70 - 99 mg/dL   Comment 1 Notify RN     Imaging / Studies: Dg Cholangiogram Operative  01/07/2015   CLINICAL DATA:  Cholelithiasis. Acute cholecystitis. Laparoscopic cholecystectomy.  EXAM: INTRAOPERATIVE CHOLANGIOGRAM  TECHNIQUE: Cholangiographic images from the C-arm fluoroscopic device were submitted for interpretation post-operatively. Please see the procedural report for the amount of contrast and the fluoroscopy time utilized.  COMPARISON:  Abdominal ultrasound yesterday. CT abdomen and pelvis 12/11/2014.  FINDINGS: Filling defect in the distal common bile duct at or just above the ampulla with a meniscus. Filling defect in the common hepatic duct changes morphology during the injection and therefore likely represents an air bubble. No filling defects elsewhere. No evidence of biliary ductal dilation. Minimal antegrade flow into the duodenum. No extravasation at the injection site in the cystic duct remnant.  IMPRESSION: 1. Filling defect in the distal common bile duct at or just above the ampulla, likely a retained common duct stone. Given the appearance of a meniscus, spasm of the sphincter of Oddi is felt less likely. 2. Filling defect in the common hepatic duct which changes morphology during the injection therefore likely represents an air bubble. 3. No evidence of biliary ductal dilation. Results were discussed directly by telephone with Dr. By. Chilton Si at the time of interpretation on 01/07/2015 at 1215 hr   Electronically Signed   By: Evangeline Dakin M.D.   On: 01/07/2015 12:18   US Abdomen Complete  01/06/2015   CLINICAL DATA:  Abdominal pain  EXAM: ULTRASOUND ABDOMEN COMPLETE  COMPARISON:  12/11/2014.   FINDINGS: Gallbladder: Gallstones are identified within the gallbladder fundus. The gallbladder wall is mildly thickened measuring 3 mm. Negative sonographic Murphy's sign.  Common bile duct: Diameter: 4.8 mm.  Liver: No focal lesion identified. The liver exhibits increased echogenicity compatible with hepatic steatosis.  IVC: No abnormality visualized.  Pancreas: Visualized portion unremarkable.  Spleen: Size and appearance within normal limits.  Right Kidney: Length: 12.2 cm. Echogenicity within normal limits. No mass or hydronephrosis visualized.  Left Kidney: Length: 12.4 cm. Echogenicity within normal limits. No mass or hydronephrosis visualized.  Abdominal aorta: No aneurysm visualized.  Other findings: None.  IMPRESSION: 1. Gallstones. The gallbladder wall is upper limits of normal in thickness measuring 3 mm. Cannot rule out mild/early cholecystitis. If further imaging is clinically indicated a hepatobiliary scan may be helpful to assess the patency of the cystic duct. 2. Hepatic steatosis.   Electronically Signed   By: Kerby Moors M.D.   On: 01/06/2015 15:59   Mr Abdomen Mrcp Wo Cm  01/08/2015   CLINICAL DATA:  Postop from cholecystectomy  for cholecystitis. Abnormal intraoperative cholangiogram shows probable choledocholithiasis.  EXAM: MRI ABDOMEN WITHOUT CONTRAST  (INCLUDING MRCP)  TECHNIQUE: Multiplanar multisequence MR imaging of the abdomen was performed. Heavily T2-weighted images of the biliary and pancreatic ducts were obtained, and three-dimensional MRCP images were rendered by post processing.  COMPARISON:  Intraoperative cholangiogram on 01/07/2015  FINDINGS: Lower chest:  Unremarkable.  Hepatobiliary: Mild hepatic steatosis is noted. A 10 mm near fluid intensity benign hemangioma seen in the posterior right hepatic lobe, which shows flash filling on previous CT on 12/11/2014. No other liver masses are identified.  Prior cholecystectomy noted. Small of postop fluid noted in the gallbladder  fossa. No evidence of biliary ductal dilatation, with common bile duct measuring 5 mm. There is a tiny rounded filling defects seen in the distal common bile duct measuring less than 5 mm on image 33/series 10, consistent with a tiny common duct stone. There is also an eccentric filling defect measuring less than 1 cm seen in the distal common duct near the ampulla on image 37/series 10, which is suspicious for a distal common bile duct stone.  Pancreas: No mass or inflammatory process visualized on this non-contrast exam. No evidence of pancreatic ductal dilatation or pancreas divisum.  Spleen:  Within normal limits in size.  Adrenal Glands/Kidneys: No adrenal mass identified. No evidence of nephrolithiasis or hydronephrosis.  Stomach/Bowel/Peritoneum:  Unremarkable.  Vascular/Lymphatic: No pathologically enlarged lymph nodes identified. No other significant abnormality identified.  Other:  None.  Musculoskeletal:  No suspicious bone lesions identified.  IMPRESSION: Postop from cholecystectomy. Small amount of postop fluid noted in gallbladder fossa.  Two small less than 5 mm filling defects in distal common bile duct, suspicious for choledocholithiasis. No evidence of biliary ductal dilatation.  Mild hepatic steatosis and 1 cm benign hemangioma in the posterior right hepatic lobe.   Electronically Signed   By: Earle Gell M.D.   On: 01/08/2015 08:01   Mr 3d Recon At Scanner  01/08/2015   CLINICAL DATA:  Postop from cholecystectomy for cholecystitis. Abnormal intraoperative cholangiogram shows probable choledocholithiasis.  EXAM: MRI ABDOMEN WITHOUT CONTRAST  (INCLUDING MRCP)  TECHNIQUE: Multiplanar multisequence MR imaging of the abdomen was performed. Heavily T2-weighted images of the biliary and pancreatic ducts were obtained, and three-dimensional MRCP images were rendered by post processing.  COMPARISON:  Intraoperative cholangiogram on 01/07/2015  FINDINGS: Lower chest:  Unremarkable.  Hepatobiliary: Mild  hepatic steatosis is noted. A 10 mm near fluid intensity benign hemangioma seen in the posterior right hepatic lobe, which shows flash filling on previous CT on 12/11/2014. No other liver masses are identified.  Prior cholecystectomy noted. Small of postop fluid noted in the gallbladder fossa. No evidence of biliary ductal dilatation, with common bile duct measuring 5 mm. There is a tiny rounded filling defects seen in the distal common bile duct measuring less than 5 mm on image 33/series 10, consistent with a tiny common duct stone. There is also an eccentric filling defect measuring less than 1 cm seen in the distal common duct near the ampulla on image 37/series 10, which is suspicious for a distal common bile duct stone.  Pancreas: No mass or inflammatory process visualized on this non-contrast exam. No evidence of pancreatic ductal dilatation or pancreas divisum.  Spleen:  Within normal limits in size.  Adrenal Glands/Kidneys: No adrenal mass identified. No evidence of nephrolithiasis or hydronephrosis.  Stomach/Bowel/Peritoneum:  Unremarkable.  Vascular/Lymphatic: No pathologically enlarged lymph nodes identified. No other significant abnormality identified.  Other:  None.  Musculoskeletal:  No suspicious bone lesions identified.  IMPRESSION: Postop from cholecystectomy. Small amount of postop fluid noted in gallbladder fossa.  Two small less than 5 mm filling defects in distal common bile duct, suspicious for choledocholithiasis. No evidence of biliary ductal dilatation.  Mild hepatic steatosis and 1 cm benign hemangioma in the posterior right hepatic lobe.   Electronically Signed   By: Earle Gell M.D.   On: 01/08/2015 08:01   Dg Hip Unilat With Pelvis 2-3 Views Left  01/06/2015   CLINICAL DATA:  Chronic left anterior hip/groin pain that has been ongoing for over 6 years and has been progressively worsening; pt states he has difficulty walking on it due to pain; pt states he had an injury many years ago  due to playing football and was supposed to have surgery but never did; pt states he has been told he had spurring also.  EXAM: LEFT HIP (WITH PELVIS) 2-3 VIEWS  COMPARISON:  08/07/2010  FINDINGS: Exam demonstrates moderate degenerative changes of the left hip and mild degenerative change of the right hip without significant change. No acute fracture or dislocation. Remainder of the exam is unchanged.  IMPRESSION: No acute findings.  Moderate osteoarthritic change of the left hip and mild osteoarthritic change of the right hip.   Electronically Signed   By: Marin Olp M.D.   On: 01/06/2015 15:26    Medications / Allergies:  Scheduled Meds: . cefTRIAXone (ROCEPHIN)  IV  2 g Intravenous Q24H  . insulin aspart  0-15 Units Subcutaneous TID WC  . pantoprazole (PROTONIX) IV  40 mg Intravenous QHS   Continuous Infusions: . dextrose 5 % and 0.9% NaCl 100 mL/hr at 01/08/15 0343   PRN Meds:.diphenhydrAMINE **OR** diphenhydrAMINE, morphine injection, ondansetron, oxyCODONE-acetaminophen  Antibiotics: Anti-infectives    Start     Dose/Rate Route Frequency Ordered Stop   01/06/15 1930  cefTRIAXone (ROCEPHIN) 2 g in dextrose 5 % 50 mL IVPB - Premix     2 g 100 mL/hr over 30 Minutes Intravenous Every 24 hours 01/06/15 1836          Assessment/Plan Chronic cholecystitis choledocholithiasis  POD#1 laparoscopic cholecystectomy with IOC---Dr. Barry Dienes +IOC +MRCP -ERCP today -post procedure, will advance diet and start PO pain meds -mobilize -IS -SCDs -will resume her SQ heparin post ERCP if okay with GI -IVF -rocephin D#2 ok to DC?  DM II -stable c/w SSI, CBGs   Franklin Clapsaddle, ANP-BC [facility] Pager [phone](7A-4:30P) For consults and floor pages call [phone](7A-4:30P)  01/08/2015 8:37 AM

## 2015-01-08 NOTE — H&P (View-Only) (Signed)
Patient's MRCP shows no biliary ductal dilitation, but is positive for two small, less than 5 mm, filling defects in the distal CBD, suspicious for stones.  He is POD #1 from lap chole with IOC by Dr. Byerly.  Continue antibiotics for now.  Will proceed with ERCP later today by Dr. Jaycey Gens.  Will give indomethacin suppositories x 2 pre-procedure.  GI ATTENDING  Case reviewed in morning report with Dr. Pyrtle. MRCP reviewed. Appears to have small distal stones. Proceed with ERCP today. The nature of the procedure and Risks previously reviewed with patient.  Lenoard Helbert N. Didi Ganaway, Jr., M.D. [facility] of Gastroenterology 

## 2015-01-08 NOTE — Anesthesia Procedure Notes (Signed)
Procedure Name: Intubation Date/Time: 01/08/2015 12:55 PM Performed by: Darcey NoraJAMES, [place] Vierling B Pre-anesthesia Checklist: Patient identified, Emergency Drugs available, Suction available, Patient being monitored and Timeout performed Patient Re-evaluated:Patient Re-evaluated prior to inductionOxygen Delivery Method: Circle system utilized Preoxygenation: Pre-oxygenation with 100% oxygen Intubation Type: IV induction Ventilation: Mask ventilation without difficulty and Oral airway inserted - appropriate to patient size Laryngoscope Size: Mac and 4 Grade View: Grade II Tube type: Oral Tube size: 7.5 mm Number of attempts: 1 Airway Equipment and Method: Stylet Placement Confirmation: ETT inserted through vocal cords under direct vision,  positive ETCO2 and breath sounds checked- equal and bilateral Secured at: 21 cm Tube secured with: Tape Dental Injury: Teeth and Oropharynx as per pre-operative assessment

## 2015-01-08 NOTE — Progress Notes (Signed)
Patient's MRCP shows no biliary ductal dilitation, but is positive for two small, less than 5 mm, filling defects in the distal CBD, suspicious for stones.  He is POD #1 from lap chole with IOC by Dr. Donell BeersByerly.  Continue antibiotics for now.  Will proceed with ERCP later today by Dr. Marina GoodellPerry.  Will give indomethacin suppositories x 2 pre-procedure.  GI ATTENDING  Case reviewed in morning report with Dr. Rhea BeltonPyrtle. MRCP reviewed. Appears to have small distal stones. Proceed with ERCP today. The nature of the procedure and Risks previously reviewed with patient.  Wilhemina BonitoJohn N. Eda KeysPerry, Jr., M.D. [facility]

## 2015-01-08 NOTE — Anesthesia Preprocedure Evaluation (Signed)
Anesthesia Evaluation  Patient identified by MRN, date of birth, ID band Patient awake    Reviewed: Allergy & Precautions, NPO status , Patient's Chart, lab work & pertinent test results  Airway Mallampati: II  TM Distance: >3 FB Neck ROM: Full    Dental   Pulmonary neg pulmonary ROS,          Cardiovascular negative cardio ROS  Rate:Normal     Neuro/Psych    GI/Hepatic negative GI ROS, Neg liver ROS,   Endo/Other  diabetes  Renal/GU negative Renal ROS     Musculoskeletal   Abdominal   Peds  Hematology   Anesthesia Other Findings   Reproductive/Obstetrics                             Anesthesia Physical Anesthesia Plan  ASA: II  Anesthesia Plan: General   Post-op Pain Management:    Induction:   Airway Management Planned: Oral ETT  Additional Equipment:   Intra-op Plan:   Post-operative Plan: Extubation in OR  Informed Consent: I have reviewed the patients History and Physical, chart, labs and discussed the procedure including the risks, benefits and alternatives for the proposed anesthesia with the patient or authorized representative who has indicated his/her understanding and acceptance.   Dental advisory given  Plan Discussed with: Anesthesiologist and CRNA  Anesthesia Plan Comments:         Anesthesia Quick Evaluation

## 2015-01-08 NOTE — Interval H&P Note (Signed)
History and Physical Interval Note:  01/08/2015 12:27 PM  Kevin CrochetKeith L Berkovich  has presented today for surgery, with the diagnosis of retained bile duct stone  The various methods of treatment have been discussed with the patient and family. After consideration of risks, benefits and other options for treatment, the patient has consented to  Procedure(s): ENDOSCOPIC RETROGRADE CHOLANGIOPANCREATOGRAPHY (ERCP) (N/A) as a surgical intervention .  The patient's history has been reviewed, patient examined, no change in status, stable for surgery.  I have reviewed the patient's chart and labs. I PERSONALLY REVIEWED THE NATURE OF THE PROCEDURE,RISKS,BENEFITS,AND ALTERNATIVES. Questions were answered to the patient's satisfaction.     Yancey FlemingsJohn Perry

## 2015-01-08 NOTE — Transfer of Care (Signed)
Immediate Anesthesia Transfer of Care Note  Patient: Kevin Sanchez  Procedure(s) Performed: Procedure(s): ENDOSCOPIC RETROGRADE CHOLANGIOPANCREATOGRAPHY (ERCP) (N/A)  Patient Location: PACU and Endoscopy Unit  Anesthesia Type:General  Level of Consciousness: awake and patient cooperative  Airway & Oxygen Therapy: Patient Spontanous Breathing and Patient connected to nasal cannula oxygen  Post-op Assessment: Report given to RN and Post -op Vital signs reviewed and stable  Post vital signs: Reviewed and stable  Last Vitals:  Filed Vitals:   01/08/15 1131  BP: 122/79  Pulse:   Temp: 36.8 C  Resp: 13    Complications: No apparent anesthesia complications

## 2015-01-09 ENCOUNTER — Encounter (HOSPITAL_COMMUNITY): Payer: Self-pay | Admitting: Internal Medicine

## 2015-01-09 LAB — GLUCOSE, CAPILLARY: Glucose-Capillary: 154 mg/dL — ABNORMAL HIGH (ref 70–99)

## 2015-01-09 MED ORDER — PANTOPRAZOLE SODIUM 40 MG PO TBEC: 40.0000 mg | DELAYED_RELEASE_TABLET | Freq: Every day | ORAL | Status: DC

## 2015-01-09 MED ORDER — OXYCODONE-ACETAMINOPHEN 5-325 MG PO TABS: 1.0000 | ORAL_TABLET | Freq: Four times a day (QID) | ORAL | Status: DC | PRN

## 2015-01-09 NOTE — Discharge Summary (Signed)
Physician Discharge Summary  Patient ID: Kevin Sanchez MRN: [id number] DOB/AGE: Jan 15, 1961 54 y.o.  Admit date: 01/06/2015 Discharge date: 01/09/2015  Admitting Diagnosis: Early acute cholecystitis DM II  Discharge Diagnosis Patient Active Problem List   Diagnosis Date Noted  . Choledocholithiasis 01/08/2015  . Abnormal findings on imaging of biliary tract 01/08/2015  . Chronic cholecystitis with calculus 01/06/2015    Consultants GI---Dr. Marina Goodell  Imaging: Dg Cholangiogram Operative  01/07/2015   CLINICAL DATA:  Cholelithiasis. Acute cholecystitis. Laparoscopic cholecystectomy.  EXAM: INTRAOPERATIVE CHOLANGIOGRAM  TECHNIQUE: Cholangiographic images from the C-arm fluoroscopic device were submitted for interpretation post-operatively. Please see the procedural report for the amount of contrast and the fluoroscopy time utilized.  COMPARISON:  Abdominal ultrasound yesterday. CT abdomen and pelvis 12/11/2014.  FINDINGS: Filling defect in the distal common bile duct at or just above the ampulla with a meniscus. Filling defect in the common hepatic duct changes morphology during the injection and therefore likely represents an air bubble. No filling defects elsewhere. No evidence of biliary ductal dilation. Minimal antegrade flow into the duodenum. No extravasation at the injection site in the cystic duct remnant.  IMPRESSION: 1. Filling defect in the distal common bile duct at or just above the ampulla, likely a retained common duct stone. Given the appearance of a meniscus, spasm of the sphincter of Oddi is felt less likely. 2. Filling defect in the common hepatic duct which changes morphology during the injection therefore likely represents an air bubble. 3. No evidence of biliary ductal dilation. Results were discussed directly by telephone with Dr. By. Fransico Him at the time of interpretation on 01/07/2015 at 1215 hr   Electronically Signed   By: Hulan Saas M.D.   On: 01/07/2015 12:18   Mr  Abdomen Mrcp Wo Cm  01/08/2015   CLINICAL DATA:  Postop from cholecystectomy for cholecystitis. Abnormal intraoperative cholangiogram shows probable choledocholithiasis.  EXAM: MRI ABDOMEN WITHOUT CONTRAST  (INCLUDING MRCP)  TECHNIQUE: Multiplanar multisequence MR imaging of the abdomen was performed. Heavily T2-weighted images of the biliary and pancreatic ducts were obtained, and three-dimensional MRCP images were rendered by post processing.  COMPARISON:  Intraoperative cholangiogram on 01/07/2015  FINDINGS: Lower chest:  Unremarkable.  Hepatobiliary: Mild hepatic steatosis is noted. A 10 mm near fluid intensity benign hemangioma seen in the posterior right hepatic lobe, which shows flash filling on previous CT on 12/11/2014. No other liver masses are identified.  Prior cholecystectomy noted. Small of postop fluid noted in the gallbladder fossa. No evidence of biliary ductal dilatation, with common bile duct measuring 5 mm. There is a tiny rounded filling defects seen in the distal common bile duct measuring less than 5 mm on image 33/series 10, consistent with a tiny common duct stone. There is also an eccentric filling defect measuring less than 1 cm seen in the distal common duct near the ampulla on image 37/series 10, which is suspicious for a distal common bile duct stone.  Pancreas: No mass or inflammatory process visualized on this non-contrast exam. No evidence of pancreatic ductal dilatation or pancreas divisum.  Spleen:  Within normal limits in size.  Adrenal Glands/Kidneys: No adrenal mass identified. No evidence of nephrolithiasis or hydronephrosis.  Stomach/Bowel/Peritoneum:  Unremarkable.  Vascular/Lymphatic: No pathologically enlarged lymph nodes identified. No other significant abnormality identified.  Other:  None.  Musculoskeletal:  No suspicious bone lesions identified.  IMPRESSION: Postop from cholecystectomy. Small amount of postop fluid noted in gallbladder fossa.  Two small less than 5 mm  filling defects in  distal common bile duct, suspicious for choledocholithiasis. No evidence of biliary ductal dilatation.  Mild hepatic steatosis and 1 cm benign hemangioma in the posterior right hepatic lobe.   Electronically Signed   By: Myles Rosenthal M.D.   On: 01/08/2015 08:01   Mr 3d Recon At Scanner  01/08/2015   CLINICAL DATA:  Postop from cholecystectomy for cholecystitis. Abnormal intraoperative cholangiogram shows probable choledocholithiasis.  EXAM: MRI ABDOMEN WITHOUT CONTRAST  (INCLUDING MRCP)  TECHNIQUE: Multiplanar multisequence MR imaging of the abdomen was performed. Heavily T2-weighted images of the biliary and pancreatic ducts were obtained, and three-dimensional MRCP images were rendered by post processing.  COMPARISON:  Intraoperative cholangiogram on 01/07/2015  FINDINGS: Lower chest:  Unremarkable.  Hepatobiliary: Mild hepatic steatosis is noted. A 10 mm near fluid intensity benign hemangioma seen in the posterior right hepatic lobe, which shows flash filling on previous CT on 12/11/2014. No other liver masses are identified.  Prior cholecystectomy noted. Small of postop fluid noted in the gallbladder fossa. No evidence of biliary ductal dilatation, with common bile duct measuring 5 mm. There is a tiny rounded filling defects seen in the distal common bile duct measuring less than 5 mm on image 33/series 10, consistent with a tiny common duct stone. There is also an eccentric filling defect measuring less than 1 cm seen in the distal common duct near the ampulla on image 37/series 10, which is suspicious for a distal common bile duct stone.  Pancreas: No mass or inflammatory process visualized on this non-contrast exam. No evidence of pancreatic ductal dilatation or pancreas divisum.  Spleen:  Within normal limits in size.  Adrenal Glands/Kidneys: No adrenal mass identified. No evidence of nephrolithiasis or hydronephrosis.  Stomach/Bowel/Peritoneum:  Unremarkable.  Vascular/Lymphatic: No  pathologically enlarged lymph nodes identified. No other significant abnormality identified.  Other:  None.  Musculoskeletal:  No suspicious bone lesions identified.  IMPRESSION: Postop from cholecystectomy. Small amount of postop fluid noted in gallbladder fossa.  Two small less than 5 mm filling defects in distal common bile duct, suspicious for choledocholithiasis. No evidence of biliary ductal dilatation.  Mild hepatic steatosis and 1 cm benign hemangioma in the posterior right hepatic lobe.   Electronically Signed   By: Myles Rosenthal M.D.   On: 01/08/2015 08:01   Dg Ercp Biliary & Pancreatic Ducts  01/08/2015   CLINICAL DATA:  54 year old male with choledocholithiasis status post cholecystectomy  EXAM: ERCP  TECHNIQUE: Multiple spot images obtained with the fluoroscopic device and submitted for interpretation post-procedure.  COMPARISON:  MRCP 01/07/2015  FINDINGS: Four intraoperative spot images obtained during ERCP. There is a flexible endoscope in the descending duodenum. Cannulation of the common bile duct and opacification of the biliary tree demonstrates multiple small filling defects within the distal common bile duct consistent with choledocholithiasis. The images subsequently demonstrate balloon sweeping of the common bile duct.  IMPRESSION: ERCP with sphincterotomy and balloon sweep of the common duct.  These images were submitted for radiologic interpretation only. Please see the procedural report for the amount of contrast and the fluoroscopy time utilized.   Electronically Signed   By: Malachy Moan M.D.   On: 01/08/2015 14:28    Procedures laparoscopic cholecystectomy with IOC---Dr. Donell Beers 01/07/15 ERCP--Dr. Marina Goodell 01/08/15  Hospital Course:  Tamari Busic is a 54 year old male with a history of obesity, left hip OA and diabetes mellitus who presented to [facility] with abdominal pain.  Workup showed early cholecystitis.  Patient was admitted and underwent procedure listed above. He  had a  positive IOC and GI was consulted. Tolerated procedure well and was transferred to the floor. The following day the patient had an ERCP with Dr. Marina GoodellPerry with sphincterotomy and stone extraction.  He tolerated the procedure well.   Diet was advanced as tolerated.  On POD#2, the patient was voiding well, tolerating diet, ambulating well, pain well controlled, vital signs stable, incisions c/d/i and felt stable for discharge home. Medication risks, benefits and therapeutic alternatives were reviewed with the patient.  He verbalizes understanding.   Patient will follow up in our office in 2 weeks and knows to call with questions or concerns.  Physical Exam: General:  Alert, NAD, pleasant, comfortable Abd:  Soft, ND, mild tenderness, incisions C/D/I    Medication List    TAKE these medications        ibuprofen 200 MG tablet  Commonly known as:  ADVIL,MOTRIN  Take 800 mg by mouth every 6 (six) hours as needed.     metFORMIN 1000 MG tablet  Commonly known as:  GLUCOPHAGE  Take 1,000 mg by mouth 2 (two) times daily with a meal.     oxyCODONE-acetaminophen 5-325 MG per tablet  Commonly known as:  PERCOCET/ROXICET  Take 1-2 tablets by mouth every 6 (six) hours as needed for moderate pain or severe pain.             Follow-up Information    Follow up with [facility] On 01/30/2015.   Why:  arrive by 1PM for a 1:30PM post op check   Contact information:   [address] [place] [phone]      Signed: Ashok Norrismina Alizay Bronkema, [facility]NP-BC [facility] [phone]  01/09/2015, 8:29 AM

## 2015-01-09 NOTE — Progress Notes (Signed)
Patient given prescriptions and discharge paperwork. No questions verbalized. Patient is walking out, refused wheelchair.

## 2015-01-09 NOTE — Discharge Instructions (Signed)

## 2015-01-09 NOTE — Progress Notes (Signed)
GI ATTENDING  Patient is doing well post ERC sphincterotomy and common duct stone extraction. No pain or other complaints. Family at bedside. Anticipate discharge.   Vital signs: BP 101/51 mmHg  Pulse 79  Temp(Src) 98.5 F (36.9 C) (Oral)  Resp 16  Ht 6' (1.829 m)  Wt 245 lb (111.131 kg)  BMI 33.22 kg/m2  SpO2 97% General: Well-developed, well-nourished, no acute distress HEENT: Sclerae are anicteric,  Lungs: Clear Heart: Regular Abdomen: soft, nontender, nondistended, Psychiatric: alert and oriented x3. Cooperative  IMPRESSION 1. Early cholecystitis (calculus) status post laparoscopic cholecystectomy 2. Choledocholithiasis status post ERC with sphincterotomy and stone extraction  PLAN 1. For discharge. Follow-up as needed

## 2015-01-23 ENCOUNTER — Emergency Department (HOSPITAL_COMMUNITY)
Admission: EM | Admit: 2015-01-23 | Discharge: 2015-01-23 | Disposition: A | Discharge status: Home / Self Care | Payer: Self-pay | Attending: Emergency Medicine | Admitting: Emergency Medicine

## 2015-01-23 ENCOUNTER — Encounter (HOSPITAL_COMMUNITY): Payer: Self-pay | Admitting: *Deleted

## 2015-01-23 DIAGNOSIS — G8929 Other chronic pain: Secondary | ICD-10-CM | POA: Insufficient documentation

## 2015-01-23 DIAGNOSIS — Z79899 Other long term (current) drug therapy: Secondary | ICD-10-CM | POA: Insufficient documentation

## 2015-01-23 DIAGNOSIS — E119 Type 2 diabetes mellitus without complications: Secondary | ICD-10-CM | POA: Insufficient documentation

## 2015-01-23 DIAGNOSIS — M25552 Pain in left hip: Secondary | ICD-10-CM | POA: Insufficient documentation

## 2015-01-23 DIAGNOSIS — R110 Nausea: Secondary | ICD-10-CM | POA: Insufficient documentation

## 2015-01-23 LAB — COMPREHENSIVE METABOLIC PANEL
ALBUMIN: 3.8 g/dL (ref 3.5–5.2)
ALT: 19 U/L (ref 0–53)
AST: 16 U/L (ref 0–37)
Alkaline Phosphatase: 64 U/L (ref 39–117)
Anion gap: 10 (ref 5–15)
BUN: 10 mg/dL (ref 6–23)
CO2: 22 mmol/L (ref 19–32)
Calcium: 10 mg/dL (ref 8.4–10.5)
Chloride: 108 mmol/L (ref 96–112)
Creatinine, Ser: 1.4 mg/dL — ABNORMAL HIGH (ref 0.50–1.35)
GFR calc Af Amer: 64 mL/min — ABNORMAL LOW (ref >90)
GFR calc non Af Amer: 55 mL/min — ABNORMAL LOW (ref >90)
Glucose, Bld: 136 mg/dL — ABNORMAL HIGH (ref 70–99)
Potassium: 4.2 mmol/L (ref 3.5–5.1)
Sodium: 140 mmol/L (ref 135–145)
Total Bilirubin: 0.3 mg/dL (ref 0.3–1.2)
Total Protein: 6.8 g/dL (ref 6.0–8.3)

## 2015-01-23 LAB — CBC WITH DIFFERENTIAL/PLATELET
BASOS ABS: 0 10*3/uL (ref 0.0–0.1)
BASOS PCT: 0 % (ref 0–1)
EOS ABS: 0.3 10*3/uL (ref 0.0–0.7)
EOS PCT: 3 % (ref 0–5)
HCT: 40.7 % (ref 39.0–52.0)
Hemoglobin: 14.2 g/dL (ref 13.0–17.0)
LYMPHS ABS: 3.7 10*3/uL (ref 0.7–4.0)
Lymphocytes Relative: 37 % (ref 12–46)
MCH: 31.3 pg (ref 26.0–34.0)
MCHC: 34.9 g/dL (ref 30.0–36.0)
MCV: 89.6 fL (ref 78.0–100.0)
MONOS PCT: 7 % (ref 3–12)
Monocytes Absolute: 0.7 10*3/uL (ref 0.1–1.0)
NEUTROS PCT: 53 % (ref 43–77)
Neutro Abs: 5.4 10*3/uL (ref 1.7–7.7)
PLATELETS: 275 10*3/uL (ref 150–400)
RBC: 4.54 MIL/uL (ref 4.22–5.81)
RDW: 12.1 % (ref 11.5–15.5)
WBC: 10 10*3/uL (ref 4.0–10.5)

## 2015-01-23 LAB — LIPASE, BLOOD: Lipase: 25 U/L (ref 11–59)

## 2015-01-23 MED ORDER — OXYCODONE-ACETAMINOPHEN 5-325 MG PO TABS
1.0000 | ORAL_TABLET | Freq: Once | ORAL | Status: AC
  Administered 2015-01-23: 1 via ORAL
  Filled 2015-01-23: qty 1

## 2015-01-23 MED ORDER — OXYCODONE-ACETAMINOPHEN 5-325 MG PO TABS: 1.0000 | ORAL_TABLET | ORAL | Status: AC | PRN

## 2015-01-23 NOTE — ED Provider Notes (Signed)
CSN: [id number]     Arrival date & time 01/23/15  1620 History   First MD Initiated Contact with Patient 01/23/15 1914     Chief Complaint  Patient presents with  . Hip Pain  . Abdominal Pain     (Consider location/radiation/quality/duration/timing/severity/associated sxs/prior Treatment) Patient is a 54 y.o. male presenting with hip pain and abdominal pain. The history is provided by the patient. No language interpreter was used.  Hip Pain This is a chronic problem. The current episode started more than 1 year ago. Associated symptoms include arthralgias and nausea. Pertinent negatives include no abdominal pain, chest pain, fever, myalgias, numbness or weakness. Associated symptoms comments: The patient complains of left hip pain without known or new injury. He reports he was told he needed a hip replacement 4 years ago and he "chickened out".   He also complains of nausea and difficulty eating since having his gall bladder out 2 weeks ago by laparoscopic procedure. He denies fever or abdominal pain since the surgery. He admits to poor diet consisting of high fat foods immediately after discharge from the hospital and he feels this caused his symptoms. He was unable to eat for the last 3 days secondary to nausea but he feels better today. He states he ate a grilled chicken sandwich on the way to the ER and currently has no nausea..  Abdominal Pain Associated symptoms: nausea   Associated symptoms: no chest pain, no constipation, no diarrhea, no fever and no shortness of breath     Past Medical History  Diagnosis Date  . Diabetes mellitus without complication    Past Surgical History  Procedure Laterality Date  . Cholecystectomy    . Ercp N/A 01/08/2015    Procedure: ENDOSCOPIC RETROGRADE CHOLANGIOPANCREATOGRAPHY (ERCP);  Surgeon: Hilarie Fredrickson, MD;  [facility] ENDOSCOPY;  Service: Endoscopy;  Laterality: N/A;  . Cholecystectomy N/A 01/07/2015    Procedure: LAPAROSCOPIC CHOLECYSTECTOMY  WITH INTRAOPERATIVE CHOLANGIOGRAM;  Surgeon: Almond Lint, MD;  Location: [facility] OR;  Service: General;  Laterality: N/A;   History reviewed. No pertinent family history. History  Substance Use Topics  . Smoking status: Never Smoker   . Smokeless tobacco: Not on file  . Alcohol Use: No    Review of Systems  Constitutional: Negative for fever.  Respiratory: Negative for shortness of breath.   Cardiovascular: Negative for chest pain.  Gastrointestinal: Positive for nausea. Negative for abdominal pain, diarrhea, constipation and blood in stool.  Musculoskeletal: Positive for arthralgias. Negative for myalgias.       See HPI.  Neurological: Negative for weakness and numbness.      Allergies  Review of patient's allergies indicates no known allergies.  Home Medications   Prior to Admission medications   Medication Sig Start Date End Date Taking? Authorizing Provider  ibuprofen (ADVIL,MOTRIN) 200 MG tablet Take 800 mg by mouth every 6 (six) hours as needed for headache or mild pain.    Yes Historical Provider, MD  metFORMIN (GLUCOPHAGE) 1000 MG tablet Take 1,000 mg by mouth 2 (two) times daily with a meal.   Yes Historical Provider, MD  oxyCODONE-acetaminophen (PERCOCET/ROXICET) 5-325 MG per tablet Take 1-2 tablets by mouth every 6 (six) hours as needed for moderate pain or severe pain. 01/09/15   Emina Riebock, NP   BP 144/85 mmHg  Pulse 67  Temp(Src) 98.1 F (36.7 C) (Oral)  Resp 13  Ht  (1.88 m)  Wt 230 lb (104.327 kg)  BMI 29.52 kg/m2  SpO2 98% Physical Exam  Constitutional: He is oriented to person, place, and time. He appears well-developed and well-nourished. No distress.  HENT:  Mouth/Throat: Oropharynx is clear and moist.  Neck: Normal range of motion.  Cardiovascular: Normal rate.   Pulmonary/Chest: Effort normal. He has no wheezes. He has no rales. He exhibits no tenderness.  Abdominal: Soft. Bowel sounds are normal. He exhibits no distension. There is no  tenderness. There is no rebound and no guarding.  Musculoskeletal: Normal range of motion.  Neurological: He is alert and oriented to person, place, and time.  Skin: Skin is warm and dry.  Psychiatric: He has a normal mood and affect.    ED Course  Procedures (including critical care time) Labs Review Labs Reviewed  COMPREHENSIVE METABOLIC PANEL - Abnormal; Notable for the following:    Glucose, Bld 136 (*)    Creatinine, Ser 1.40 (*)    GFR calc non Af Amer 55 (*)    GFR calc Af Amer 64 (*)    All other components within normal limits  CBC WITH DIFFERENTIAL/PLATELET  LIPASE, BLOOD    Imaging Review No results found.   EKG Interpretation None      MDM   Final diagnoses:  None    1. Nausea, resolved 2. Chronic pain, left hip  No further nausea. Patient and wife feel his abdominal symptoms are resolved and he states he would like help with his hip pain only. Pain medication provided with improvement in symptoms. Feel he is stable for discharge.     Elpidio AnisShari Zilah Villaflor, PA-C 01/23/15 2108  Doug SouSam Jacubowitz, MD 01/24/15 [phone]

## 2015-01-23 NOTE — Discharge Instructions (Signed)
FOLLOW UP WITH YOUR ORTHOPEDIST OR WITH DR. Eulah PontMURPHY FOR FURTHER MANAGEMENT OF HIP PAIN.

## 2015-01-23 NOTE — ED Notes (Signed)
Pt reports having bladder surgery on 3/12 and having abd pain, lack of appetite since. Also having left hip pain, hx of same. Ambulatory at triage. Denies vomiting or diarrhea.

## 2015-01-30 ENCOUNTER — Telehealth: Payer: Self-pay | Admitting: Internal Medicine

## 2015-01-30 NOTE — Telephone Encounter (Signed)
I did not place a stent, just removed stones. Is he thinking about some other procedure? Urologic? Other?

## 2015-01-30 NOTE — Telephone Encounter (Signed)
Dr. Marina GoodellPerry pt states he had an ERCP done and was told to follow-up in 2 weeks to have stent removed. Does pt need and OV or procedure? Please advise.

## 2015-01-30 NOTE — Telephone Encounter (Signed)
Discussed with pt that he did not need to follow-up with GI, per discharge summary from hospital pt was to see [facility] today for follow-up. Pt given phone number for [facility] to reschedule appt.

## 2015-02-14 ENCOUNTER — Telehealth: Payer: Self-pay

## 2015-02-14 NOTE — Telephone Encounter (Signed)
Pt scheduled to see Willette ClusterPaula Guenther NP 02/19/15@2 :30pm. OV requested from [facility] for diarrhea. Left message for pt to call back.

## 2015-02-15 NOTE — Telephone Encounter (Signed)
Spoke with pts wife and she is aware of appt. °

## 2015-02-19 ENCOUNTER — Ambulatory Visit: Payer: Self-pay | Admitting: Nurse Practitioner

## 2015-02-19 ENCOUNTER — Telehealth: Payer: Self-pay | Admitting: Internal Medicine

## 2015-02-19 NOTE — Telephone Encounter (Signed)
Pts wife states he needs to cancel the appt for today and that he has to have an appt after 3pm. Pt scheduled to see Dr. Marina GoodellPerry 03/29/15@3 :15pm. Wife aware of appt.

## 2015-03-29 ENCOUNTER — Ambulatory Visit: Payer: Self-pay | Admitting: Internal Medicine

## 2015-04-27 ENCOUNTER — Emergency Department (HOSPITAL_BASED_OUTPATIENT_CLINIC_OR_DEPARTMENT_OTHER): Admission: EM | Admit: 2015-04-27 | Discharge: 2015-04-27 | Discharge status: Against Medical Advice | Payer: MEDICAID

## 2015-04-27 ENCOUNTER — Emergency Department (HOSPITAL_COMMUNITY)
Admission: EM | Admit: 2015-04-27 | Discharge: 2015-04-27 | Disposition: A | Discharge status: Home / Self Care | Payer: Self-pay | Attending: Emergency Medicine | Admitting: Emergency Medicine

## 2015-04-27 ENCOUNTER — Encounter (HOSPITAL_COMMUNITY): Payer: Self-pay | Admitting: *Deleted

## 2015-04-27 DIAGNOSIS — Z8719 Personal history of other diseases of the digestive system: Secondary | ICD-10-CM | POA: Insufficient documentation

## 2015-04-27 DIAGNOSIS — G8929 Other chronic pain: Secondary | ICD-10-CM | POA: Insufficient documentation

## 2015-04-27 DIAGNOSIS — M25552 Pain in left hip: Secondary | ICD-10-CM | POA: Insufficient documentation

## 2015-04-27 DIAGNOSIS — E669 Obesity, unspecified: Secondary | ICD-10-CM | POA: Insufficient documentation

## 2015-04-27 DIAGNOSIS — Z79899 Other long term (current) drug therapy: Secondary | ICD-10-CM | POA: Insufficient documentation

## 2015-04-27 DIAGNOSIS — E119 Type 2 diabetes mellitus without complications: Secondary | ICD-10-CM | POA: Insufficient documentation

## 2015-04-27 LAB — CBG MONITORING, ED: GLUCOSE-CAPILLARY: 132 mg/dL — AB (ref 65–99)

## 2015-04-27 MED ORDER — OXYCODONE-ACETAMINOPHEN 5-325 MG PO TABS
2.0000 | ORAL_TABLET | Freq: Once | ORAL | Status: AC
  Administered 2015-04-27: 2 via ORAL
  Filled 2015-04-27: qty 2

## 2015-04-27 MED ORDER — KETOROLAC TROMETHAMINE 60 MG/2ML IM SOLN
30.0000 mg | Freq: Once | INTRAMUSCULAR | Status: AC
  Administered 2015-04-27: 30 mg via INTRAMUSCULAR
  Filled 2015-04-27: qty 2

## 2015-04-27 NOTE — Discharge Instructions (Signed)
Return to the emergency room with worsening of symptoms, new symptoms or with symptoms that are concerning, especially fevers, redness, swelling, numbness, tingling. RICE: Rest, Ice (three cycles of 20 mins on, 20mins off at least twice a day), compression/brace, elevation. Heating pad works well for back pain. Ibuprofen 400mg  (2 tablets 200mg ) every 5-6 hours for 3-5 days. Call to [facility] to establish care with a primary care provider. Also called a make appointment with orthopedics for further workup of your chronic left hip pain. Read below information and follow recommendations. Hip Pain Your hip is the joint between your upper legs and your lower pelvis. The bones, cartilage, tendons, and muscles of your hip joint perform a lot of work each day supporting your body weight and allowing you to move around. Hip pain can range from a minor ache to severe pain in one or both of your hips. Pain may be felt on the inside of the hip joint near the groin, or the outside near the buttocks and upper thigh. You may have swelling or stiffness as well.  HOME CARE INSTRUCTIONS   Take medicines only as directed by your health care provider.  Apply ice to the injured area:  Put ice in a plastic bag.  Place a towel between your skin and the bag.  Leave the ice on for 15-20 minutes at a time, 3-4 times a day.  Keep your leg raised (elevated) when possible to lessen swelling.  Avoid activities that cause pain.  Follow specific exercises as directed by your health care provider.  Sleep with a pillow between your legs on your most comfortable side.  Record how often you have hip pain, the location of the pain, and what it feels like. SEEK MEDICAL CARE IF:   You are unable to put weight on your leg.  Your hip is red or swollen or very tender to touch.  Your pain or swelling continues or worsens after 1 week.  You have increasing difficulty walking.  You  have a fever. SEEK IMMEDIATE MEDICAL CARE IF:   You have fallen.  You have a sudden increase in pain and swelling in your hip. MAKE SURE YOU:   Understand these instructions.  Will watch your condition.  Will get help right away if you are not doing well or get worse. Document Released: 04/02/2010 Document Revised: 02/27/2014 Document Reviewed: 06/09/2013 [facility] Patient Information 2015 [employer] [place]. This information is not intended to replace advice given to you by your health care provider. Make sure you discuss any questions you have with your health care provider.

## 2015-04-27 NOTE — ED Notes (Signed)
The pt requested that his cbg  Be checked

## 2015-04-27 NOTE — ED Notes (Signed)
The pt is c/o lt hip pain for 6 years.  His pain has been worse for the past several days or weeks.  He needed a hip rfepalcement   But has not  Had it yet

## 2015-04-27 NOTE — ED Provider Notes (Signed)
CSN: [id number]     Arrival date & time 04/27/15  1743 History   This chart was scribed for Kevin ConroyVictoria Adalynn Corne, PA-C working with No att. providers found by Elveria Risingimelie Horne, ED Scribe. This patient was seen in room TR07C/TR07C and the patient's care was started at [facility]6:42 PM.   Chief Complaint  Patient presents with  . Hip Pain   The history is provided by the patient. No language interpreter was used.   HPI Comments: Kevin CrochetKeith L Sanchez is a 54 y.o. male with PMHx of osteoarthritis who presents to the Emergency Department complaining of acute on chronic left hip pain radiating into his groin. 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 Patient states that has not had to the procedure due pulled government funding from a treatment program he was participating in. Patient reports that his treatment with ibuprofen is no longer effective. Patient describes intermittent, waxing waning pain, stating that "some days are worse that others" and his current episode has become "excruciating." Patient shares that ambulation and flexion of hip especially aggravate his pain and that strangely compression of left groin works to alleviate his pain. Patient denies fever, chills, abdominal pain, numbness or tingling. Patient does not have orthopedist. Patient denies cardiac history.   Past Medical History  Diagnosis Date  . Diabetes mellitus without complication   . Cholecystitis   . Hepatic steatosis   . Obesity   . Osteoarthritis    Past Surgical History  Procedure Laterality Date  . Cholecystectomy    . Ercp N/A 01/08/2015    Procedure: ENDOSCOPIC RETROGRADE CHOLANGIOPANCREATOGRAPHY (ERCP);  Surgeon: Hilarie FredricksonJohn N Perry, MD;  [facility];  Service: Endoscopy;  Laterality: N/A;  . Cholecystectomy N/A 01/07/2015    Procedure: LAPAROSCOPIC CHOLECYSTECTOMY WITH INTRAOPERATIVE CHOLANGIOGRAM;  Surgeon: Almond LintFaera Byerly, MD;  Location: [facility] OR;   Service: General;  Laterality: N/A;   No family history on file. History  Substance Use Topics  . Smoking status: Never Smoker   . Smokeless tobacco: Not on file  . Alcohol Use: No    Review of Systems  Constitutional: Negative for fever and chills.  Gastrointestinal: Negative for nausea, vomiting and abdominal pain.  Musculoskeletal: Positive for arthralgias. Negative for gait problem.    Allergies  Review of patient's allergies indicates no known allergies.  Home Medications   Prior to Admission medications   Medication Sig Start Date End Date Taking? Authorizing Provider  ibuprofen (ADVIL,MOTRIN) 200 MG tablet Take 800 mg by mouth every 6 (six) hours as needed for headache or mild pain.     Historical Provider, MD  metFORMIN (GLUCOPHAGE) 1000 MG tablet Take 1,000 mg by mouth 2 (two) times daily with a meal.    Historical Provider, MD  oxyCODONE-acetaminophen (PERCOCET/ROXICET) 5-325 MG per tablet Take 1-2 tablets by mouth every 4 (four) hours as needed for severe pain. 01/23/15   Elpidio AnisShari Upstill, PA-C   Triage Vitals: BP 130/74 mmHg  Pulse 84  Temp(Src) 98.3 F (36.8 C)  Resp 18  Ht 6\' 2"  (1.88 m)  Wt 225 lb 7 oz (102.258 kg)  BMI 28.93 kg/m2  SpO2 98% Physical Exam  Constitutional: He appears well-developed and well-nourished. No distress.  HENT:  Head: Normocephalic and atraumatic.  Eyes: Conjunctivae and EOM are normal. Right eye exhibits no discharge. Left eye exhibits no discharge.  Cardiovascular: Normal rate and regular rhythm.   Pulmonary/Chest: Effort normal and breath sounds normal. No respiratory distress. He  has no wheezes.  Abdominal: Soft. Bowel sounds are normal. He exhibits no distension. There is no tenderness.  Musculoskeletal:  2+ DP and PT pulses equal bilaterally. Sensation intact. Full ROM of left hip.  Point tenderness to left hip. No erythema, swelling or red streaks.  Neurological: He is alert. He exhibits normal muscle tone. Coordination  normal.  5/5 strength in bilateral lower extremities.   Skin: Skin is warm and dry. He is not diaphoretic.  Psychiatric: He has a normal mood and affect. His behavior is normal.  Nursing note and vitals reviewed.   ED Course  Procedures (including critical care time)  COORDINATION OF CARE: 6:53 PM- Discussed treatment plan with patient at bedside and patient agreed to plan.   Labs Review Labs Reviewed  CBG MONITORING, ED - Abnormal; Notable for the following:    Glucose-Capillary 132 (*)    All other components within normal limits    Imaging Review No results found.   EKG Interpretation None      Meds given in ED:  Medications  oxyCODONE-acetaminophen (PERCOCET/ROXICET) 5-325 MG per tablet 2 tablet (2 tablets Oral Given 04/27/15 1859)  ketorolac (TORADOL) injection 30 mg (30 mg Intramuscular Given 04/27/15 1859)    Discharge Medication List as of 04/27/2015  6:57 PM        MDM   Final diagnoses:  Chronic left hip pain   Patient presenting with chronic hip pain with acute worsening. Neurovascularly intact. No infectious symptoms and no evidence on exam. Patient with recent x-ray 3-4 months ago with evidence of moderate osteoarthritis. No new injury. Patient's pain managed in the ED. No prescription for narcotics provided. Patient given [facility] to establish care with a primary care provider as well as a referral to orthopedics. Patient well-appearing and nontoxic. No abdominal pain on exam. I doubt abdominal pathology. Patient discharged in good condition with all questions answered.  Discussed return precautions with patient. Patient verbalizes understanding and agrees with plan.  I personally performed the services described in this documentation, which was scribed in my presence. The recorded information has been reviewed and is accurate.   Kevin Conroy, PA-C 04/27/15 2311  Jerelyn Scott, MD 04/27/15 [phone]

## 2016-11-18 ENCOUNTER — Emergency Department (HOSPITAL_COMMUNITY)
Admission: EM | Admit: 2016-11-18 | Discharge: 2016-11-18 | Disposition: A | Discharge status: Home / Self Care | Payer: Self-pay | Attending: Emergency Medicine | Admitting: Emergency Medicine

## 2016-11-18 ENCOUNTER — Encounter (HOSPITAL_COMMUNITY): Payer: Self-pay | Admitting: Emergency Medicine

## 2016-11-18 DIAGNOSIS — R109 Unspecified abdominal pain: Secondary | ICD-10-CM | POA: Insufficient documentation

## 2016-11-18 DIAGNOSIS — Z5321 Procedure and treatment not carried out due to patient leaving prior to being seen by health care provider: Secondary | ICD-10-CM | POA: Insufficient documentation

## 2016-11-18 LAB — CBC
HCT: 43.1 % (ref 39.0–52.0)
Hemoglobin: 14.9 g/dL (ref 13.0–17.0)
MCH: 30.9 pg (ref 26.0–34.0)
MCHC: 34.6 g/dL (ref 30.0–36.0)
MCV: 89.4 fL (ref 78.0–100.0)
Platelets: 220 10*3/uL (ref 150–400)
RBC: 4.82 MIL/uL (ref 4.22–5.81)
RDW: 12.3 % (ref 11.5–15.5)
WBC: 8.9 10*3/uL (ref 4.0–10.5)

## 2016-11-18 LAB — URINALYSIS, ROUTINE W REFLEX MICROSCOPIC
Bacteria, UA: NONE SEEN
Bilirubin Urine: NEGATIVE
Hgb urine dipstick: NEGATIVE
Ketones, ur: NEGATIVE mg/dL
Leukocytes, UA: NEGATIVE
NITRITE: NEGATIVE
PROTEIN: NEGATIVE mg/dL
Specific Gravity, Urine: 1.022 (ref 1.005–1.030)
WBC UA: NONE SEEN WBC/hpf (ref 0–5)
pH: 5 (ref 5.0–8.0)

## 2016-11-18 LAB — COMPREHENSIVE METABOLIC PANEL
ALT: 23 U/L (ref 17–63)
AST: 19 U/L (ref 15–41)
Albumin: 3.2 g/dL — ABNORMAL LOW (ref 3.5–5.0)
Alkaline Phosphatase: 46 U/L (ref 38–126)
Anion gap: 8 (ref 5–15)
BUN: 7 mg/dL (ref 6–20)
CALCIUM: 8.6 mg/dL — AB (ref 8.9–10.3)
CO2: 22 mmol/L (ref 22–32)
Chloride: 108 mmol/L (ref 101–111)
Creatinine, Ser: 1.11 mg/dL (ref 0.61–1.24)
GFR calc non Af Amer: >60 mL/min (ref >60)
Glucose, Bld: 234 mg/dL — ABNORMAL HIGH (ref 65–99)
Potassium: 4.1 mmol/L (ref 3.5–5.1)
Sodium: 138 mmol/L (ref 135–145)
Total Bilirubin: 0.5 mg/dL (ref 0.3–1.2)
Total Protein: 5.4 g/dL — ABNORMAL LOW (ref 6.5–8.1)

## 2016-11-18 LAB — LIPASE, BLOOD: Lipase: 19 U/L (ref 11–51)

## 2016-11-18 NOTE — ED Triage Notes (Signed)
abd pain x 5 days  Denies n/v/d  No dysuria

## 2016-11-18 NOTE — ED Notes (Addendum)
Pt has stated that he is tired of waiting to be seen and that he is going to go home and rest. Pt stated that if his pain returns that he will come back and try to be seen later.

## 2016-12-25 ENCOUNTER — Other Ambulatory Visit: Payer: Self-pay | Admitting: Emergency Medicine

## 2016-12-25 DIAGNOSIS — Z006 Encounter for examination for normal comparison and control in clinical research program: Secondary | ICD-10-CM

## 2016-12-26 ENCOUNTER — Ambulatory Visit
Admission: RE | Admit: 2016-12-26 | Discharge: 2016-12-26 | Disposition: A | Discharge status: Home / Self Care | Payer: No Typology Code available for payment source | Source: Ambulatory Visit | Attending: Emergency Medicine | Admitting: Emergency Medicine

## 2016-12-26 DIAGNOSIS — Z006 Encounter for examination for normal comparison and control in clinical research program: Secondary | ICD-10-CM

## 2017-06-02 IMAGING — MR MR ABDOMEN W/O CM
14 of 16 series · 42 of 48 positions shown · non-contrast
Comparison: 01/08/2015 MRCP.

CLINICAL DATA: Research study.

EXAM:
MRI ABDOMEN WITHOUT CONTRAST
TECHNIQUE: Multiplanar multisequence MR imaging was performed without the
administration of intravenous contrast.

[Series 4: lms_(id)_dixon_(id) · axial · 6.0mm · 1.56mm/px · 1 of 8 slices shown (1 of 2)]
[im 1/8]
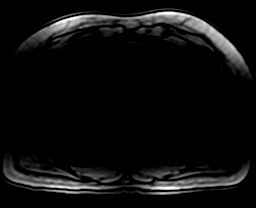

[Series 5: lms_(id)_dixon_(id) · axial · 6.0mm · 1.56mm/px · 1 of 8 slices shown (2 of 2)]
[im 1/8]
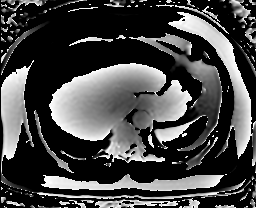

[Series 6: (person_name) · axial · 8.0mm · 1.15mm/px · 1 of 7 slices shown (1 of 4)]
[im 1/7]
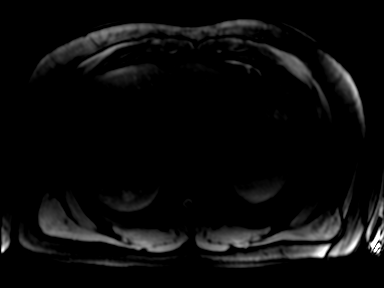

[Series 7: (person_name)_(person_name)1 · axial · 8.0mm · 1.15mm/px · 1 of 1 slices shown (1 of 4)]
[im 1/1]
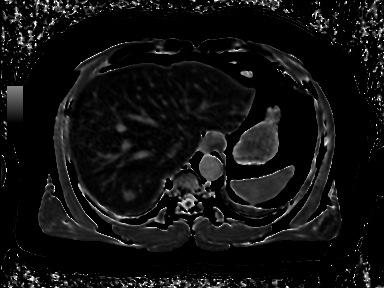

[Series 8: (person_name) · axial · 8.0mm · 1.15mm/px · 1 of 7 slices shown (2 of 4)]
[im 1/7]
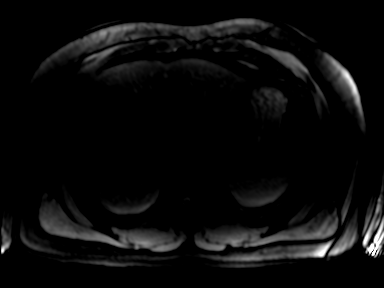

[Series 9: (person_name)_(person_name)1 · axial · 8.0mm · 1.15mm/px · 1 of 1 slices shown (2 of 4)]
[im 1/1]
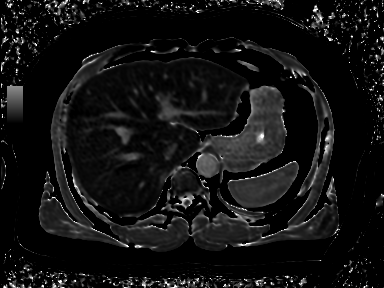

[Series 10: (person_name) · axial · 8.0mm · 1.15mm/px · 1 of 7 slices shown (3 of 4)]
[im 1/7]
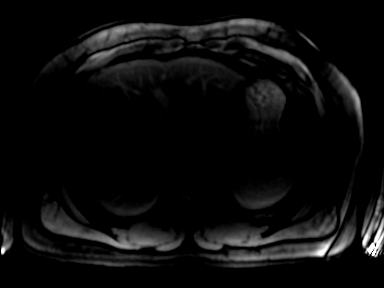

[Series 11: (person_name)_(person_name)1 · axial · 8.0mm · 1.15mm/px · 1 of 1 slices shown (3 of 4)]
[im 1/1]
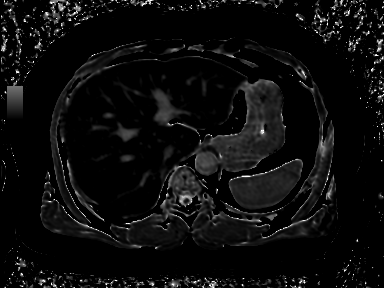

[Series 12: (person_name) · axial · 8.0mm · 1.15mm/px · 1 of 7 slices shown (4 of 4)]
[im 1/7]
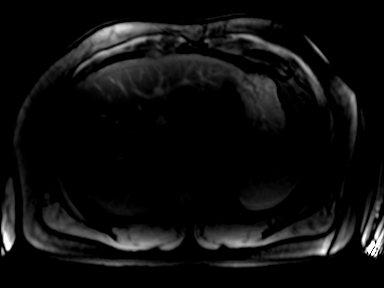

[Series 13: (person_name)_(person_name)1 · axial · 8.0mm · 1.15mm/px · 1 of 1 slices shown (4 of 4)]
[im 1/1]
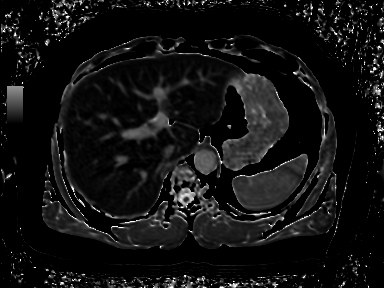

[Series 14: DIXON · axial · 10.0mm · 1.72mm/px · z∈[+81,+141]mm · 13 of 60 slices shown (1 of 2)]
[im 1/60]
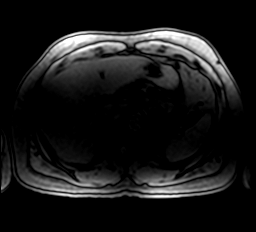
[im 5/60]
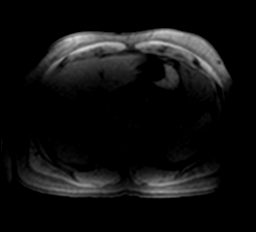
[im 10/60]
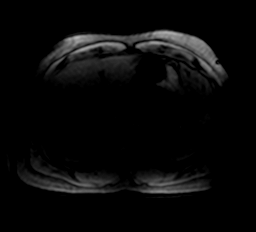
[im 15/60]
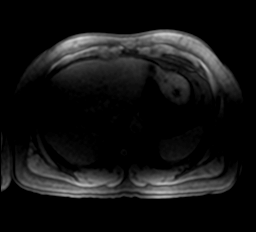
[im 20/60]
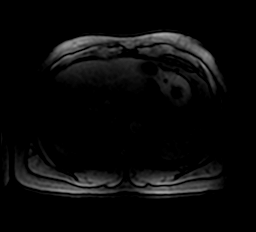
[im 25/60]
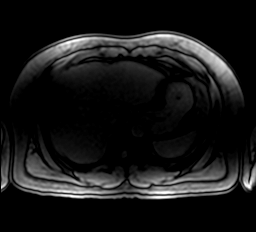
[im 30/60]
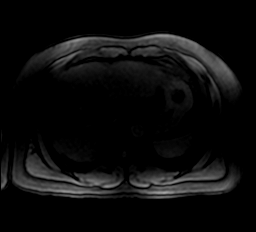
[im 35/60]
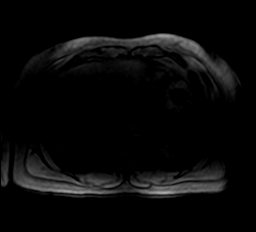
[im 40/60]
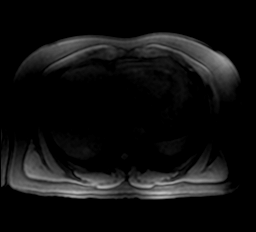
[im 45/60]
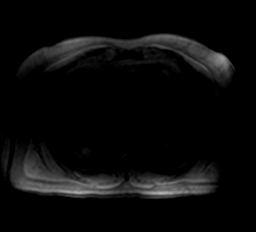
[im 50/60]
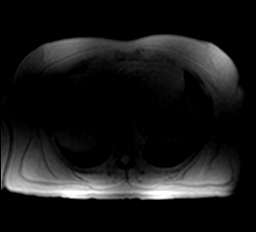
[im 55/60]
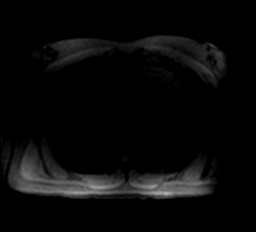
[im 60/60]
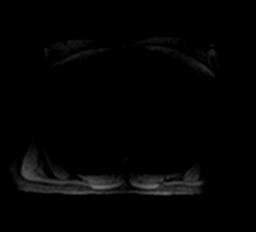

[Series 15: DIXON · axial · 10.0mm · 1.72mm/px · z∈[+81,+141]mm · 13 of 60 slices shown (2 of 2)]
[im 1/60]
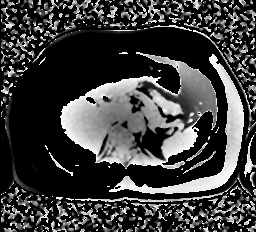
[im 5/60]
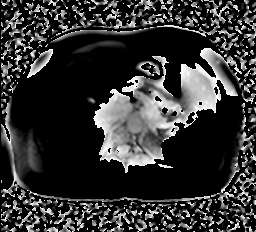
[im 10/60]
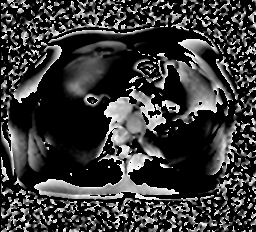
[im 15/60]
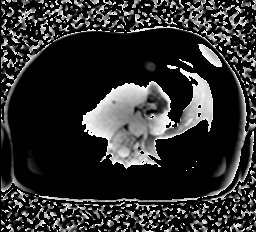
[im 20/60]
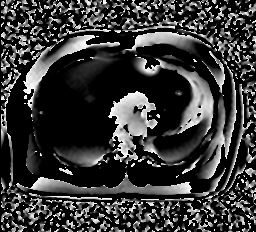
[im 25/60]
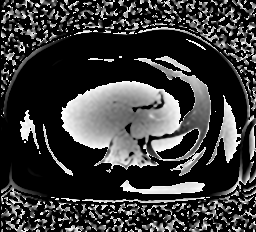
[im 30/60]
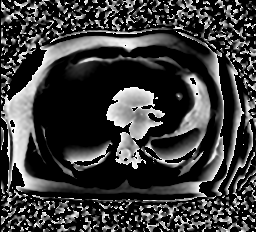
[im 35/60]
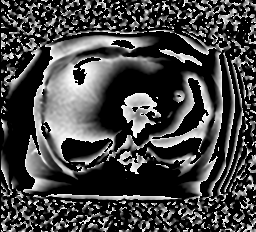
[im 40/60]
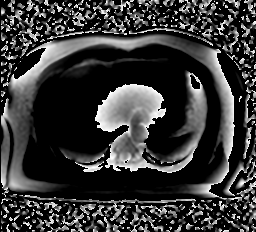
[im 45/60]
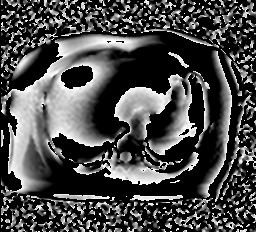
[im 50/60]
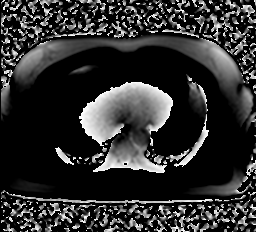
[im 55/60]
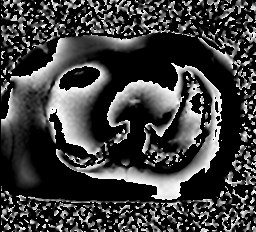
[im 60/60]
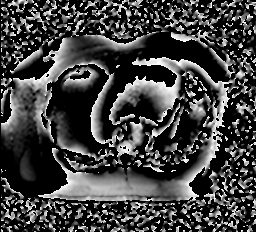

[Series 17: tse · axial · 10.0mm · 1.95mm/px · z∈[-111,+109]mm · 3 of 12 slices shown (1 of 2)]
[im 1/12]
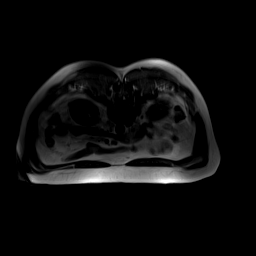
[im 6/12]
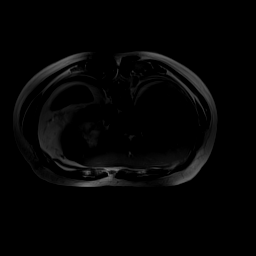
[im 12/12]
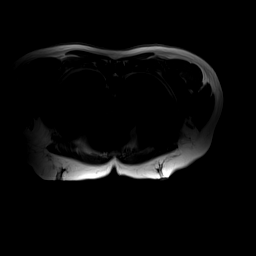

[Series 18: tse · axial · 10.0mm · 1.95mm/px · z∈[-341,-121]mm · 3 of 12 slices shown (2 of 2)]
[im 1/12]
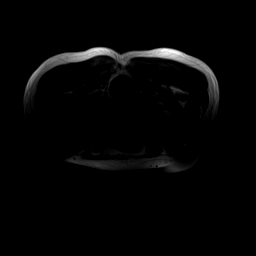
[im 6/12]
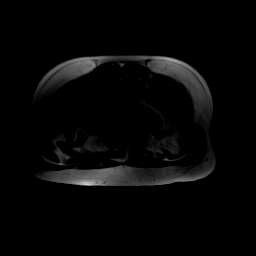
[im 12/12]
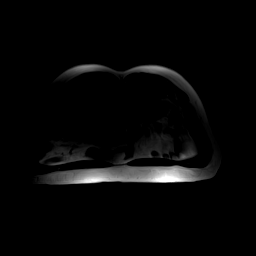

[42 of 48 positions shown; findings below may reference images not displayed]

FINDINGS: Limited, nondiagnostic quality images demonstrate similar size of a
posterior right hepatic lobe T2 hyperintense lesion on image
11/series 3 which measures 15 mm. This is consistent with a
hemangioma on the prior diagnostic exam. Limited imaging of the
spleen, stomach, pancreas, adrenal glands, kidneys within normal
limits. Convex right lumbar spine curvature.
IMPRESSION: No acute findings in the incompletely imaged abdomen.
